# Patient Record
Sex: Female | Born: 1935 | Race: White | Hispanic: No | Smoking: Former smoker
Health system: Southern US, Community
[De-identification: ages and names within clinical notes are randomized; demographics above are authoritative.]

## PROBLEM LIST (undated history)

## (undated) DIAGNOSIS — K2970 Gastritis, unspecified, without bleeding: Secondary | ICD-10-CM

## (undated) DIAGNOSIS — I2510 Atherosclerotic heart disease of native coronary artery without angina pectoris: Secondary | ICD-10-CM

## (undated) DIAGNOSIS — R55 Syncope and collapse: Secondary | ICD-10-CM

## (undated) DIAGNOSIS — IMO0002 Reserved for concepts with insufficient information to code with codable children: Secondary | ICD-10-CM

## (undated) DIAGNOSIS — H3530 Unspecified macular degeneration: Secondary | ICD-10-CM

## (undated) DIAGNOSIS — H269 Unspecified cataract: Secondary | ICD-10-CM

## (undated) DIAGNOSIS — M1990 Unspecified osteoarthritis, unspecified site: Secondary | ICD-10-CM

## (undated) DIAGNOSIS — Z87891 Personal history of nicotine dependence: Secondary | ICD-10-CM

## (undated) DIAGNOSIS — R079 Chest pain, unspecified: Secondary | ICD-10-CM

## (undated) HISTORY — DX: Unspecified macular degeneration: H35.30

## (undated) HISTORY — DX: Gastritis, unspecified, without bleeding: K29.70

## (undated) HISTORY — DX: Unspecified osteoarthritis, unspecified site: M19.90

## (undated) HISTORY — DX: Unspecified cataract: H26.9

## (undated) HISTORY — PX: BREAST BIOPSY: SHX20

## (undated) HISTORY — DX: Reserved for concepts with insufficient information to code with codable children: IMO0002

---
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
## 1961-05-13 HISTORY — PX: EXPLORATORY LAPAROTOMY: SUR591

## 1987-05-14 HISTORY — PX: CHOLECYSTECTOMY: SHX55

## 2006-12-14 ENCOUNTER — Emergency Department: Payer: Self-pay | Admitting: Emergency Medicine

## 2007-04-29 ENCOUNTER — Ambulatory Visit: Payer: Self-pay | Admitting: Internal Medicine

## 2010-03-28 ENCOUNTER — Ambulatory Visit: Payer: Self-pay | Admitting: Gastroenterology

## 2010-03-29 LAB — PATHOLOGY REPORT

## 2010-04-02 ENCOUNTER — Ambulatory Visit: Payer: Self-pay | Admitting: Gastroenterology

## 2010-08-28 ENCOUNTER — Ambulatory Visit: Payer: Self-pay | Admitting: Internal Medicine

## 2010-09-11 ENCOUNTER — Other Ambulatory Visit: Payer: Self-pay | Admitting: Gastroenterology

## 2010-09-13 LAB — CANCER ANTIGEN 19-9: CA 19-9: 60 U/mL — ABNORMAL HIGH (ref 0–35)

## 2010-09-23 ENCOUNTER — Ambulatory Visit: Payer: Self-pay | Admitting: Emergency Medicine

## 2010-10-03 ENCOUNTER — Ambulatory Visit: Payer: Self-pay | Admitting: Gastroenterology

## 2011-04-15 ENCOUNTER — Ambulatory Visit: Payer: Self-pay | Admitting: Ophthalmology

## 2011-04-23 ENCOUNTER — Ambulatory Visit: Payer: Self-pay | Admitting: Ophthalmology

## 2011-05-16 ENCOUNTER — Encounter: Payer: Self-pay | Admitting: Internal Medicine

## 2011-05-16 ENCOUNTER — Ambulatory Visit (INDEPENDENT_AMBULATORY_CARE_PROVIDER_SITE_OTHER): Payer: Medicare Other | Admitting: Internal Medicine

## 2011-05-16 VITALS — BP 110/66 | HR 56 | Temp 97.7°F | Wt 113.0 lb

## 2011-05-16 DIAGNOSIS — Z23 Encounter for immunization: Secondary | ICD-10-CM

## 2011-05-16 DIAGNOSIS — H6690 Otitis media, unspecified, unspecified ear: Secondary | ICD-10-CM

## 2011-05-16 DIAGNOSIS — H3530 Unspecified macular degeneration: Secondary | ICD-10-CM

## 2011-05-16 DIAGNOSIS — Z1239 Encounter for other screening for malignant neoplasm of breast: Secondary | ICD-10-CM

## 2011-05-16 DIAGNOSIS — D0430 Carcinoma in situ of skin of unspecified part of face: Secondary | ICD-10-CM

## 2011-05-16 DIAGNOSIS — D0439 Carcinoma in situ of skin of other parts of face: Secondary | ICD-10-CM

## 2011-05-16 DIAGNOSIS — K8050 Calculus of bile duct without cholangitis or cholecystitis without obstruction: Secondary | ICD-10-CM

## 2011-05-16 MED ORDER — AZITHROMYCIN 500 MG PO TABS: 500.0000 mg | ORAL_TABLET | Freq: Every day | ORAL | Status: AC

## 2011-05-16 NOTE — Patient Instructions (Addendum)
Your sinus symptoms sound viral,  But your left ear looks infected so I am prescribing Azithromycin for 5 days of antibiotic..   You can use sudafed PE  to decrease the pressure in the ear and the sinuses 10 mg every 6 hours,  (OTC)  Try Delsym for that dry cough ,  Mucinex for a thick hard to break up cough   Gargle with salt water for a scratchy throat.  Rinse your sinuses with sterile salt water Simply Saline twice daily

## 2011-05-16 NOTE — Progress Notes (Signed)
Subjective:    Patient ID: Debra Hubbard, female    DOB: 03-19-1927, 78 y.o.   MRN: [id number]  HPI  Debra Hubbard is an 78 yo white female with history of a deviated septum due to prior trauma (remote) , recent cataract surgery,who  presents with a 4 day history of sinus drainage and persistent congestion.  She has not taken any OTC meds for her current symptoms because she had cataract surgery on the right eye 3 weeks ago and is using an antibiotic drop.  She denies myalgias, heach or neck aches, and fevers. She has had a nonproductive cough, not particularly troublesome, and some sneezing.  She has no prior history of influenza vaccination and is requesting one today.    Past Medical History  Diagnosis Date  . Macular degeneration   . Cataracts, bilateral   . Squamous cell carcinoma     face, s/p excision  . Degenerative joint disease   . Gastritis     followed by Dr. Bluford Kaufmann    No current outpatient prescriptions on file prior to visit.    Review of Systems  Constitutional: Negative for fever, chills and unexpected weight change.  HENT: Positive for congestion, rhinorrhea, sneezing, postnasal drip and sinus pressure. Negative for hearing loss, ear pain, nosebleeds, sore throat, facial swelling, mouth sores, trouble swallowing, neck pain, neck stiffness, voice change, tinnitus and ear discharge.   Eyes: Negative for pain, discharge, redness and visual disturbance.  Respiratory: Positive for cough. Negative for chest tightness, shortness of breath, wheezing and stridor.   Cardiovascular: Negative for chest pain, palpitations and leg swelling.  Musculoskeletal: Negative for myalgias and arthralgias.  Skin: Negative for color change and rash.  Neurological: Negative for dizziness, weakness, light-headedness and headaches.  Hematological: Negative for adenopathy.   BP 110/66  Pulse 56  Temp(Src) 97.7 F (36.5 C) (Oral)  Wt 113 lb (51.256 kg)     Objective:   Physical Exam  Vitals  reviewed. Constitutional: She is oriented to person, place, and time. She appears well-developed and well-nourished.  HENT:  Right Ear: Tympanic membrane is erythematous. A middle ear effusion is present.  Left Ear: Tympanic membrane is erythematous. A middle ear effusion is present.  Mouth/Throat: Uvula is midline and mucous membranes are normal. Posterior oropharyngeal erythema present.  Eyes: EOM are normal. Pupils are equal, round, and reactive to light. No scleral icterus.  Neck: Normal range of motion. Neck supple. No JVD present. No thyromegaly present.  Cardiovascular: Normal rate, regular rhythm, normal heart sounds and intact distal pulses.   Pulmonary/Chest: Effort normal and breath sounds normal.  Abdominal: Soft. Bowel sounds are normal. She exhibits no mass. There is no tenderness.  Musculoskeletal: Normal range of motion. She exhibits no edema.  Lymphadenopathy:    She has no cervical adenopathy.  Neurological: She is alert and oriented to person, place, and time.  Skin: Skin is warm and dry.  Psychiatric: She has a normal mood and affect.      Assessment & Plan:  Otitis media: Involving both ears, left greater than right;  secondary to sinus congestion.  Will treat with antiiobiotic and decongestant.  Choledocholithiasis She underwent ERCP by Lutricia Feil in June 2012 for recurrent abdominal pain with abnormal liver enzymes and had mass was found which was biopsied,  Path report inconclusive, and she was referred to [facility] for further testing.  Records of additional evaluation are not available, so the diagnosis is unclear. Records to be requested from [facility] and  [facility]  Macular degeneration Managed by [facility],  Screening for breast cancer She reports that her last screening was 2008.  She has had several biopsies in the past which were nonmalignant.   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 Roseanne Kaufman     Updated Medication  List Outpatient Encounter Prescriptions as of 05/16/2011  Medication Sig Dispense Refill  . Multiple Vitamin (MULTIVITAMIN) tablet Take 1 tablet by mouth daily.        . Multiple Vitamins-Minerals (VISION-VITE PRESERVE PO) Take one by mouth twice a day.       Marland Kitchen azithromycin (ZITHROMAX) 500 MG tablet Take 1 tablet (500 mg total) by mouth daily.  5 tablet  0

## 2011-05-18 ENCOUNTER — Encounter: Payer: Self-pay | Admitting: Internal Medicine

## 2011-05-18 DIAGNOSIS — K8050 Calculus of bile duct without cholangitis or cholecystitis without obstruction: Secondary | ICD-10-CM | POA: Insufficient documentation

## 2011-05-18 DIAGNOSIS — H3530 Unspecified macular degeneration: Secondary | ICD-10-CM | POA: Insufficient documentation

## 2011-05-18 DIAGNOSIS — Z1239 Encounter for other screening for malignant neoplasm of breast: Secondary | ICD-10-CM | POA: Insufficient documentation

## 2011-05-18 DIAGNOSIS — D0430 Carcinoma in situ of skin of unspecified part of face: Secondary | ICD-10-CM | POA: Insufficient documentation

## 2011-05-18 NOTE — Assessment & Plan Note (Signed)
She reports that her last screenign was 2008.  She has had several biopsies in the past which were nonmalignant.

## 2011-05-18 NOTE — Assessment & Plan Note (Signed)
She underwent ERCP by Lutricia Feil in June 2012 for recurrent abdominal pain with abnormal liver enzymes and had mass was found which was biopsied,  Path report inconclusive, and she was referred to [facility] for further testing.  Records of additional evaluation are not available, so the diagnosis is unclear. Records to be requested from [facility] and [facility]

## 2011-05-18 NOTE — Assessment & Plan Note (Signed)
Excised by Dr. Roseanne Kaufman

## 2011-05-18 NOTE — Assessment & Plan Note (Signed)
Managed by [facility],

## 2011-06-10 ENCOUNTER — Encounter: Payer: Self-pay | Admitting: Internal Medicine

## 2011-08-21 ENCOUNTER — Ambulatory Visit (INDEPENDENT_AMBULATORY_CARE_PROVIDER_SITE_OTHER): Payer: Medicare Other | Admitting: Internal Medicine

## 2011-08-21 ENCOUNTER — Encounter: Payer: Self-pay | Admitting: Internal Medicine

## 2011-08-21 VITALS — BP 114/62 | HR 68 | Temp 98.2°F | Resp 16 | Wt 118.0 lb

## 2011-08-21 DIAGNOSIS — J0100 Acute maxillary sinusitis, unspecified: Secondary | ICD-10-CM

## 2011-08-21 MED ORDER — AMOXICILLIN-POT CLAVULANATE 875-125 MG PO TABS: 1.0000 | ORAL_TABLET | Freq: Two times a day (BID) | ORAL | Status: DC

## 2011-08-21 MED ORDER — LEVOFLOXACIN 500 MG PO TABS: 500.0000 mg | ORAL_TABLET | Freq: Every day | ORAL | Status: AC

## 2011-08-21 NOTE — Progress Notes (Signed)
Patient ID: Debra Hubbard, female   DOB: 04-26-1927, 78 y.o.   MRN: [id number] Patient Active Problem List  Diagnoses  . Choledocholithiasis  . Screening for breast cancer  . Macular degeneration  . 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 Sinusitis, acute maxillary    Subjective:  CC:   Chief Complaint  Patient presents with  . Nasal Congestion    HPI:   Debra Hubbard a 78 y.o. female who presents with post nasal drip which started last Friday.  She has had no relief with benadryl.   No fevers,  Dizziness or feeling off balance.  Has not taken sudafed PE but tried using Simply Saline last night with no relief of congestion on left side which remains congested and painful.     Past Medical History  Diagnosis Date  . Macular degeneration   . Cataracts, bilateral   . Squamous cell carcinoma     face, s/p excision  . Degenerative joint disease   . Gastritis     followed by Dr. Bluford Kaufmann    Past Surgical History  Procedure Date  . Appendectomy 1957  . Oophorectomy 1957  . Exploratory laparotomy 1963  . Breast biopsy     x 3, all normal         The following portions of the patient's history were reviewed and updated as appropriate: Allergies, current medications, and problem list.    Review of Systems:   12 Pt  review of systems was negative except those addressed in the HPI.     History   Social History  . Marital Status: Widowed    Spouse Name: N/A    Number of Children: N/A  . Years of Education: N/A   Occupational History  . Not on file.   Social History Main Topics  . Smoking status: Former Smoker    Quit date: 08/20/1984  . Smokeless tobacco: Never Used  . Alcohol Use: No  . Drug Use: No  . Sexually Active: Not on file   Other Topics Concern  . Not on file   Social History Narrative  . No narrative on file    Objective:  BP 114/62  Pulse 68  Temp(Src) 98.2 F (36.8 C) (Oral)  Resp 16  Wt 118 lb (53.524 kg)  SpO2 96%   General appearance: alert, cooperative and appears stated age Ears: normal Right TM' and external ear canals both ears; Left TM injected slightly  Nose:  palpably tender over left maxillary sinus,   Throat: lips, mucosa, and tongue normal; teeth and gums normal Neck: no adenopathy, no carotid bruit, supple, symmetrical, trachea midline and thyroid not enlarged, symmetric, no tenderness/mass/nodules Back: symmetric, no curvature. ROM normal. No CVA tenderness. Lungs: clear to auscultation bilaterally Heart: regular rate and rhythm, S1, S2 normal, no murmur, click, rub or gallop Abdomen: soft, non-tender; bowel sounds normal; no masses,  no organomegaly Pulses: 2+ and symmetric Skin: Skin color, texture, turgor normal. No rashes or lesions Lymph nodes: Cervical, supraclavicular, and axillary nodes normal.  Assessment and Plan:  Sinusitis, acute maxillary Given chronicity of symptoms, development of facial pain and exam consistent with bacterial URI,  Will treat with empiric antibiotics, decongestants, and saline lavage.     Updated Medication List Outpatient Encounter Prescriptions as of 08/21/2011  Medication Sig Dispense Refill  . Multiple Vitamin (MULTIVITAMIN) tablet Take 1 tablet by mouth daily.        . Multiple Vitamins-Minerals (VISION-VITE PRESERVE PO) Take one by  mouth twice a day.       . levofloxacin (LEVAQUIN) 500 MG tablet Take 1 tablet (500 mg total) by mouth daily. PLEASE DISREGARD/CANCEL THE AUGMENTIN RX PREVIOUSLY SENT  7 tablet  7  . DISCONTD: amoxicillin-clavulanate (AUGMENTIN) 875-125 MG per tablet Take 1 tablet by mouth 2 (two) times daily.  14 tablet  0     No orders of the defined types were placed in this encounter.    No Follow-up on file.

## 2011-08-21 NOTE — Patient Instructions (Signed)
You have a sinus/ear infection.  I am prescribing an antibiotic (augmentin) to manage the infection and the inflammation in your ear/sinuses.   I also advise use of the following OTC meds to help with your other symptoms.   Take generic OTC benadryl 25 mg every 8 hours for the drainage,  Sudafed PE  10 to 30 mg every 8 hours for the congestion, you may substitute Afrin nasal spray for the nighttime dose of sudafed PE  If needed to prevent insomnia.  Flush  your sinuses twice daily with Simply Saline (do over the sink because if you do it right you will spit out globs of mucus)  Gargle with salt water as needed for sore throat.

## 2011-08-25 ENCOUNTER — Encounter: Payer: Self-pay | Admitting: Internal Medicine

## 2011-08-25 NOTE — Assessment & Plan Note (Signed)
Given chronicity of symptoms, development of facial pain and exam consistent with bacterial URI,  Will treat with empiric antibiotics, decongestants, and saline lavage.   

## 2011-11-18 IMAGING — RF DG UGI W/ SMALL BOWEL
2 series · 7 of 24 positions shown · non-contrast
Comparison: none

REASON FOR EXAM: abd pain and vomiting
COMMENTS:

[Series 1: view not recorded · 0.17mm/px · 4 of 5 slices shown]
[im 1/5]
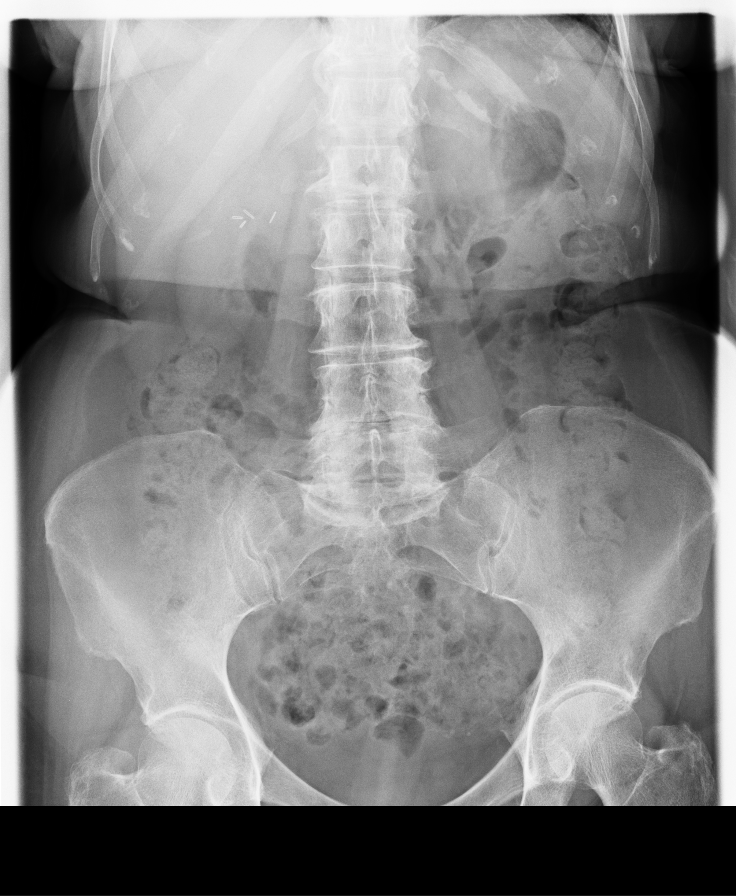
[im 2/5]
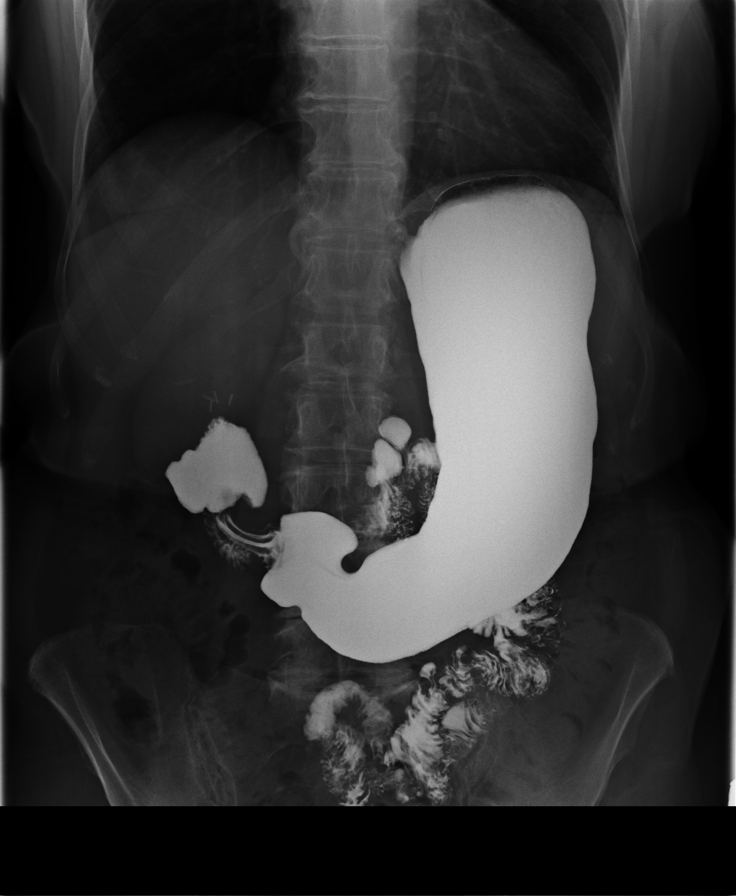
[im 3/5]
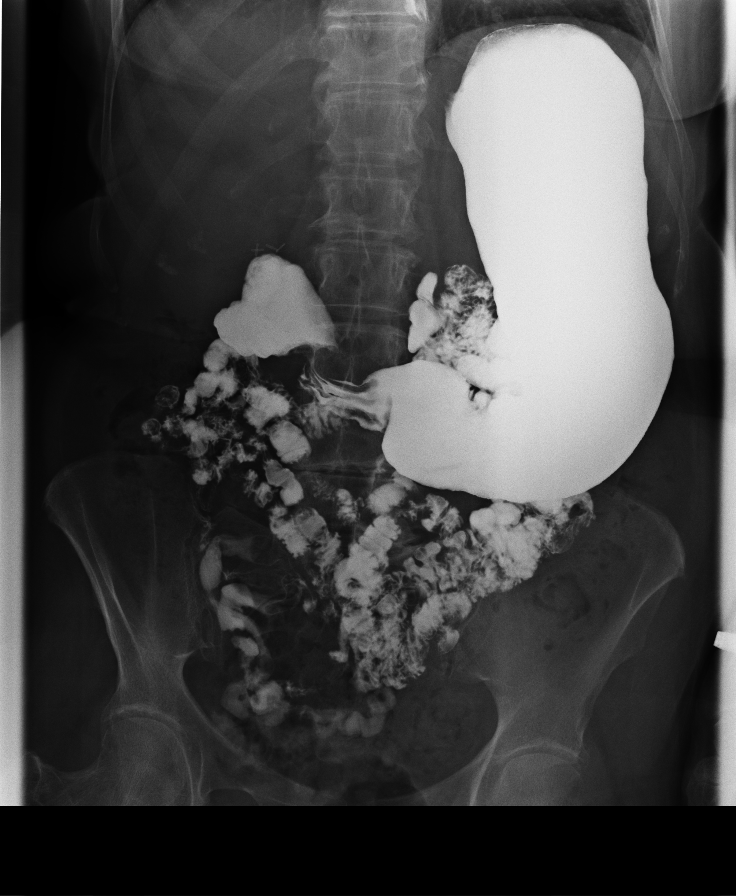
[im 5/5]
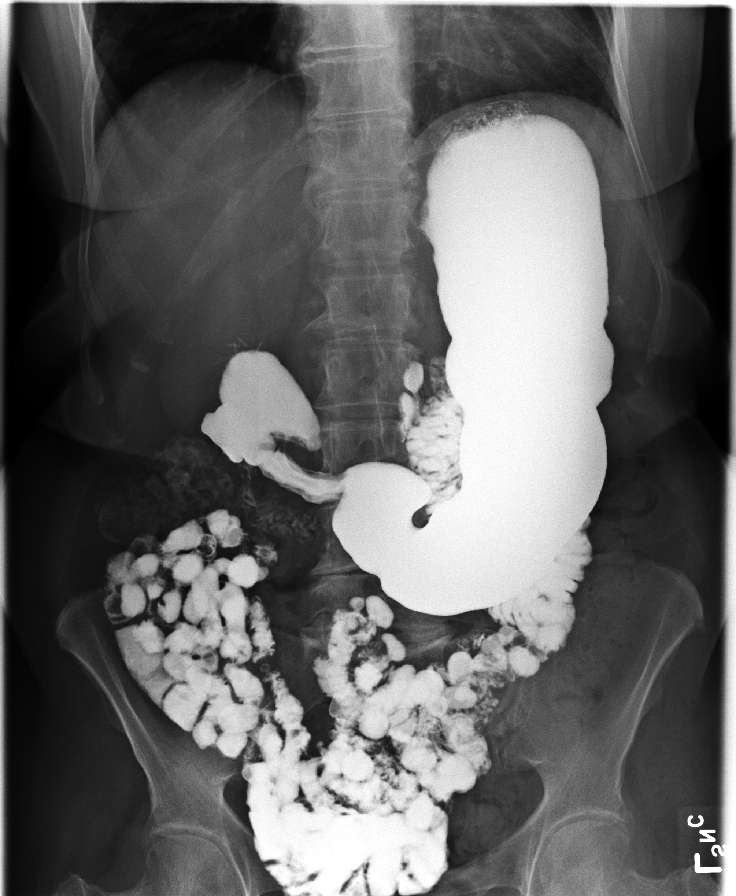

[Series 1: run · 3 of 22 slices shown]
[im 1/22]
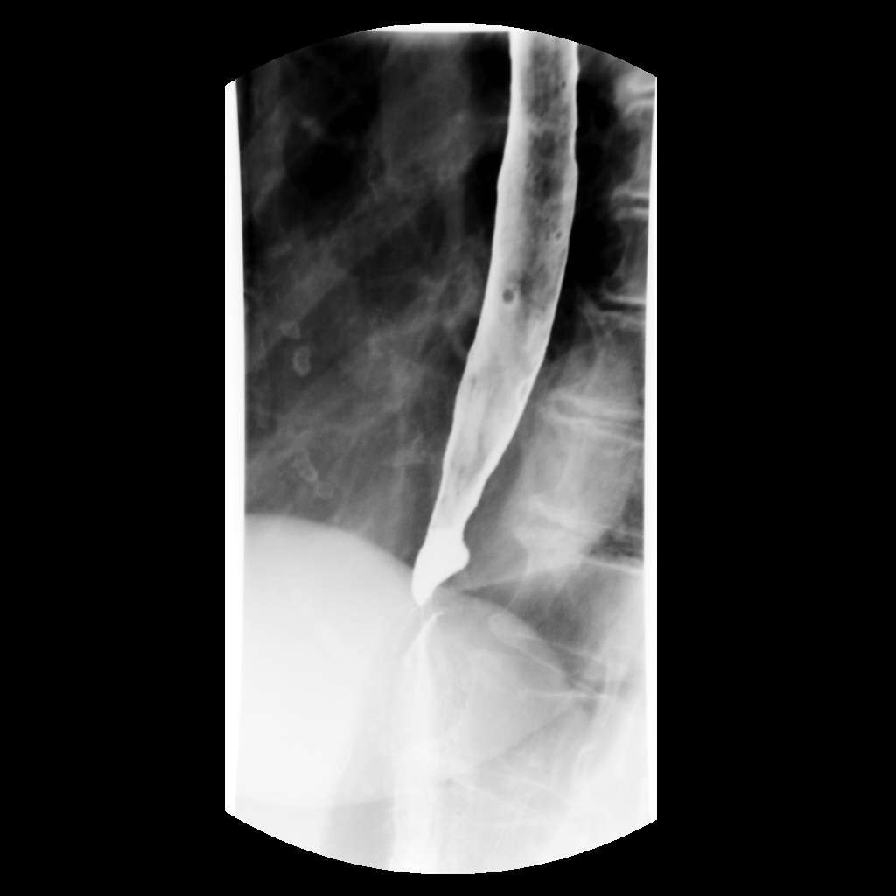
[im 2/22]
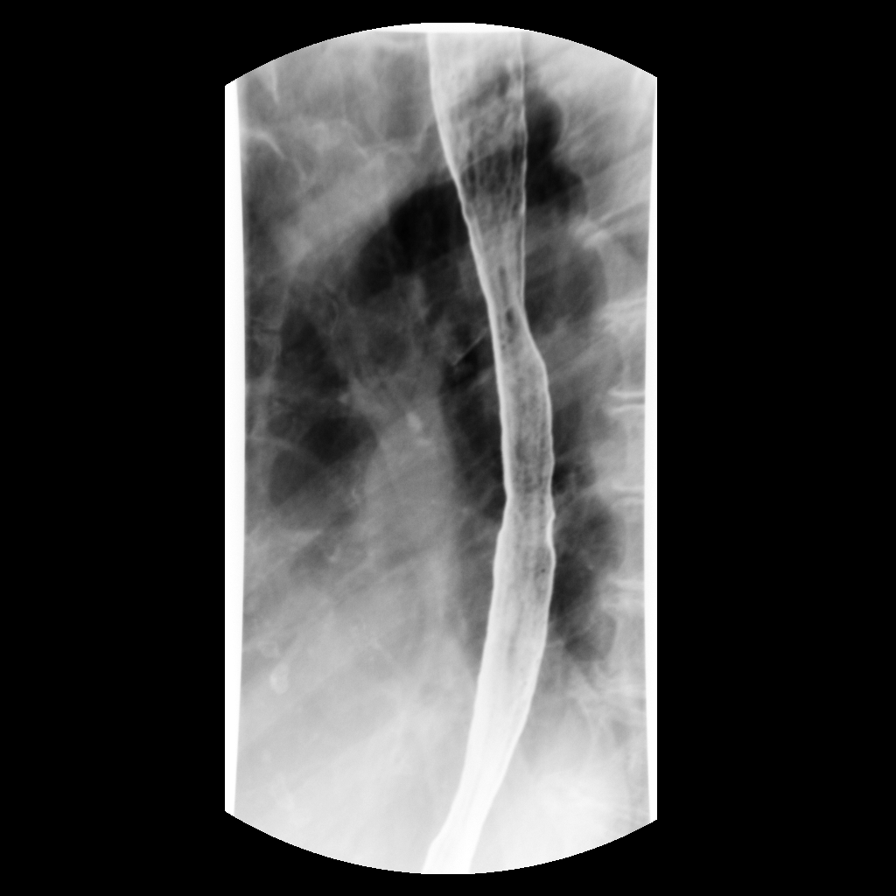
[im 3/22]
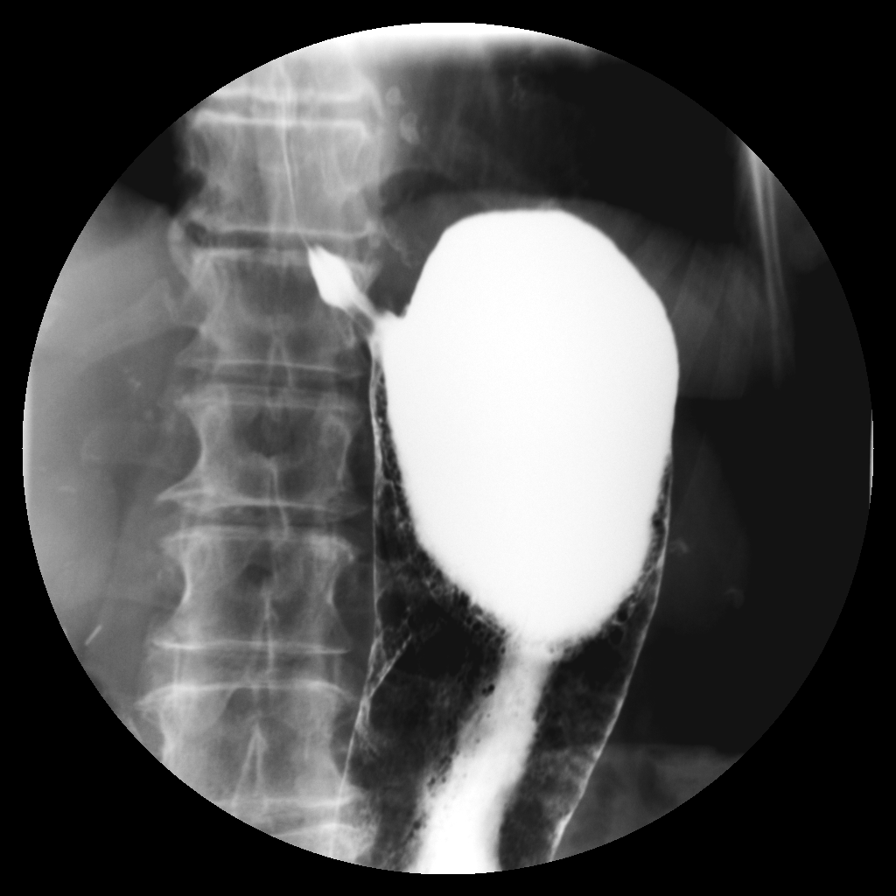

[7 of 24 positions shown; findings below may reference images not displayed]

PROCEDURE:     FL  - FL UPPER GI WITH SMALL BOWEL  - August 28, 2010 [DATE]

RESULT:     The anticipated procedure was discussed with Ms. Jim. She
voiced her willingness to proceed. A scout film revealed a normal stool and
gas pattern though there may be an element of constipation present. There
are surgical clips in the gallbladder fossa. Very mild degenerative change
of the lumbar spine is noted.

The patient ingested barium without difficulty. The thoracic esophagus is
normal in a survey fashion. A tiny reducible hiatal hernia is demonstrated.
The stomach distends well. The mucosal fold pattern appeared normal. Gastric
emptying also is normal in appearance. There are multiple duodenal
diverticula noted. The largest is in the the second portion of the duodenum.
It measures at least 2.5 cm in diameter. There is no evidence of obstruction
to gastric emptying. The 12 mm barium pill passes without difficulty.

Over the course of approximately 45 minutes the barium passed through the
small bowel to the right colon. The terminal ileum is normal in appearance.
No abnormally dilated or narrowed small bowel loops are demonstrated. The
patient described tenderness in the epigastrium during palpation of the
abdomen.
IMPRESSION: 1. There is a small hiatal hernia but the stomach is normal in appearance.
2. There are numerous duodenal diverticula. There is no evidence of duodenal
obstruction. No ulceration of the margins of these diverticula is
demonstrated.
3. The jejunum and ileum demonstrate normal mucosal patterns with no
evidence of obstruction.
4. The terminal ileum is normal in appearance.

## 2012-02-27 ENCOUNTER — Ambulatory Visit (INDEPENDENT_AMBULATORY_CARE_PROVIDER_SITE_OTHER): Payer: Medicare Other | Admitting: Internal Medicine

## 2012-02-27 ENCOUNTER — Encounter: Payer: Self-pay | Admitting: *Deleted

## 2012-02-27 ENCOUNTER — Encounter: Payer: Self-pay | Admitting: Internal Medicine

## 2012-02-27 VITALS — BP 128/62 | HR 56 | Temp 98.6°F | Wt 118.5 lb

## 2012-02-27 DIAGNOSIS — R5383 Other fatigue: Secondary | ICD-10-CM

## 2012-02-27 DIAGNOSIS — R5381 Other malaise: Secondary | ICD-10-CM

## 2012-02-27 DIAGNOSIS — E785 Hyperlipidemia, unspecified: Secondary | ICD-10-CM

## 2012-02-27 DIAGNOSIS — D649 Anemia, unspecified: Secondary | ICD-10-CM

## 2012-02-27 DIAGNOSIS — D510 Vitamin B12 deficiency anemia due to intrinsic factor deficiency: Secondary | ICD-10-CM

## 2012-02-27 DIAGNOSIS — Z23 Encounter for immunization: Secondary | ICD-10-CM

## 2012-02-27 DIAGNOSIS — E039 Hypothyroidism, unspecified: Secondary | ICD-10-CM

## 2012-02-27 DIAGNOSIS — J309 Allergic rhinitis, unspecified: Secondary | ICD-10-CM

## 2012-02-27 LAB — CBC WITH DIFFERENTIAL/PLATELET
Basophils Absolute: 0 10*3/uL (ref 0.0–0.1)
Eosinophils Absolute: 0.1 10*3/uL (ref 0.0–0.7)
Hemoglobin: 13.4 g/dL (ref 12.0–15.0)
Lymphocytes Relative: 31.5 % (ref 12.0–46.0)
MCHC: 33.2 g/dL (ref 30.0–36.0)
Monocytes Relative: 10.4 % (ref 3.0–12.0)
Neutrophils Relative %: 55.7 % (ref 43.0–77.0)
RBC: 4.39 Mil/uL (ref 3.87–5.11)
RDW: 13.2 % (ref 11.5–14.6)

## 2012-02-27 LAB — COMPREHENSIVE METABOLIC PANEL
ALT: 13 U/L (ref 0–35)
AST: 22 U/L (ref 0–37)
Albumin: 3.7 g/dL (ref 3.5–5.2)
CO2: 31 mEq/L (ref 19–32)
Calcium: 9.2 mg/dL (ref 8.4–10.5)
Chloride: 102 mEq/L (ref 96–112)
GFR: 117.1 mL/min (ref >60.00)
Potassium: 3.8 mEq/L (ref 3.5–5.1)
Sodium: 138 mEq/L (ref 135–145)
Total Protein: 7.2 g/dL (ref 6.0–8.3)

## 2012-02-27 LAB — TSH: TSH: 1.02 u[IU]/mL (ref 0.35–5.50)

## 2012-02-27 LAB — VITAMIN B12: Vitamin B-12: 310 pg/mL (ref 211–911)

## 2012-02-27 LAB — LIPID PANEL
HDL: 40.8 mg/dL (ref >39.00)
Total CHOL/HDL Ratio: 4

## 2012-02-27 NOTE — Assessment & Plan Note (Signed)
Symptoms are most consistent with allergic rhinitis. Will start Claritin or Zyrtec over-the-counter at bedtime. We also discussed potentially adding nasal steroid but patient would like to defer for now. Patient will call if symptoms are not improving.

## 2012-02-27 NOTE — Assessment & Plan Note (Signed)
Fatigue has been chronic. No focal symptoms. Will check basic lab her today including CBC, CMP, TSH, and B12. Followup in 3 months.

## 2012-02-27 NOTE — Progress Notes (Signed)
  Subjective:    Patient ID: Debra Hubbard, female    DOB: [birth year], 78 y.o.   MRN: [id number]  HPI 78 year old female presents for acute visit complaining of clear nasal drainage, watery eyes, and occasional sneezing which have occurred over the last couple of weeks. She denies any fever, chills, ear pain, sinus pressure. She denies shortness of breath. She reports she is generally been feeling well. She does acknowledge some decreased energy level which has been chronic for her. She reports she is sleeping well. She denies any change in appetite or weight loss.  Outpatient Encounter Prescriptions as of 02/27/2012  Medication Sig Dispense Refill  . Multiple Vitamin (MULTIVITAMIN) tablet Take 1 tablet by mouth daily.        . Multiple Vitamins-Minerals (VISION-VITE PRESERVE PO) Take one by mouth twice a day.        BP 128/62  Pulse 56  Temp 98.6 F (37 C) (Oral)  Wt 118 lb 8 oz (53.751 kg)  SpO2 96%  Review of Systems  Constitutional: Positive for fatigue. Negative for fever, chills, appetite change and unexpected weight change.  HENT: Positive for rhinorrhea, sneezing and postnasal drip. Negative for ear pain, congestion, sore throat, trouble swallowing, neck pain, voice change and sinus pressure.   Eyes: Positive for itching. Negative for visual disturbance.  Respiratory: Negative for cough, shortness of breath, wheezing and stridor.   Cardiovascular: Negative for chest pain, palpitations and leg swelling.  Gastrointestinal: Negative for nausea, vomiting, abdominal pain, diarrhea, constipation, blood in stool, abdominal distention and anal bleeding.  Genitourinary: Negative for dysuria and flank pain.  Musculoskeletal: Negative for myalgias, arthralgias and gait problem.  Skin: Negative for color change and rash.  Neurological: Negative for dizziness and headaches.  Hematological: Negative for adenopathy. Does not bruise/bleed easily.  Psychiatric/Behavioral: Negative for suicidal  ideas, disturbed wake/sleep cycle and dysphoric mood. The patient is not nervous/anxious.        Objective:   Physical Exam  Constitutional: She is oriented to person, place, and time. She appears well-developed and well-nourished. No distress.  HENT:  Head: Normocephalic and atraumatic.  Right Ear: External ear normal.  Left Ear: External ear normal.  Nose: Nose normal.  Mouth/Throat: Oropharynx is clear and moist. No oropharyngeal exudate.  Eyes: Conjunctivae normal are normal. Pupils are equal, round, and reactive to light. Right eye exhibits no discharge. Left eye exhibits no discharge. No scleral icterus.  Neck: Normal range of motion. Neck supple. No tracheal deviation present. No thyromegaly present.  Cardiovascular: Normal rate, regular rhythm, normal heart sounds and intact distal pulses.  Exam reveals no gallop and no friction rub.   No murmur heard. Pulmonary/Chest: Effort normal and breath sounds normal. No respiratory distress. She has no wheezes. She has no rales. She exhibits no tenderness.  Musculoskeletal: Normal range of motion. She exhibits no edema and no tenderness.  Lymphadenopathy:    She has no cervical adenopathy.  Neurological: She is alert and oriented to person, place, and time. No cranial nerve deficit. She exhibits normal muscle tone. Coordination normal.  Skin: Skin is warm and dry. No rash noted. She is not diaphoretic. No erythema. No pallor.  Psychiatric: She has a normal mood and affect. Her behavior is normal. Judgment and thought content normal.          Assessment & Plan:

## 2012-04-01 ENCOUNTER — Ambulatory Visit (INDEPENDENT_AMBULATORY_CARE_PROVIDER_SITE_OTHER): Payer: Medicare Other | Admitting: Internal Medicine

## 2012-04-01 ENCOUNTER — Encounter: Payer: Self-pay | Admitting: Internal Medicine

## 2012-04-01 VITALS — BP 116/66 | HR 61 | Temp 98.0°F | Resp 16 | Ht 62.5 in | Wt 116.8 lb

## 2012-04-01 DIAGNOSIS — M5432 Sciatica, left side: Secondary | ICD-10-CM

## 2012-04-01 DIAGNOSIS — M5430 Sciatica, unspecified side: Secondary | ICD-10-CM

## 2012-04-01 NOTE — Assessment & Plan Note (Signed)
Symptoms most consistent with left sciatic pain. Discussed evaluation with plain film lumbar spine and hip. Discussed using prednisone taper pack and PT, however pt would like to defer for now. She will continue prn tylenol and continue to monitor symptoms. Follow up prn.

## 2012-04-01 NOTE — Patient Instructions (Signed)
Sciatica with Rehab The sciatic nerve runs from the back down the leg and is responsible for sensation and control of the muscles in the back (posterior) side of the thigh, lower leg, and foot. Sciatica is a condition that is characterized by inflammation of this nerve.  SYMPTOMS   Signs of nerve damage, including numbness and/or weakness along the posterior side of the lower extremity.  Pain in the back of the thigh that may also travel down the leg.  Pain that worsens when sitting for long periods of time.  Occasionally, pain in the back or buttock. CAUSES  Inflammation of the sciatic nerve is the cause of sciatica. The inflammation is due to something irritating the nerve. Common sources of irritation include:  Sitting for long periods of time.  Direct trauma to the nerve.  Arthritis of the spine.  Herniated or ruptured disk.  Slipping of the vertebrae (spondylolithesis)  Pressure from soft tissues, such as muscles or ligament-like tissue (fascia). RISK INCREASES WITH:  Sports that place pressure or stress on the spine (football or weightlifting).  Poor strength and flexibility.  Failure to warm-up properly before activity.  Family history of low back pain or disk disorders.  Previous back injury or surgery.  Poor body mechanics, especially when lifting, or poor posture. PREVENTION   Warm up and stretch properly before activity.  Maintain physical fitness:  Strength, flexibility, and endurance.  Cardiovascular fitness.  Learn and use proper technique, especially with posture and lifting. When possible, have coach correct improper technique.  Avoid activities that place stress on the spine. PROGNOSIS If treated properly, then sciatica usually resolves within 6 weeks. However, occasionally surgery is necessary.  RELATED COMPLICATIONS   Permanent nerve damage, including pain, numbness, tingle, or weakness.  Chronic back pain.  Risks of surgery: infection,  bleeding, nerve damage, or damage to surrounding tissues. TREATMENT Treatment initially involves resting from any activities that aggravate your symptoms. The use of ice and medication may help reduce pain and inflammation. The use of strengthening and stretching exercises may help reduce pain with activity. These exercises may be performed at home or with referral to a therapist. A therapist may recommend further treatments, such as transcutaneous electronic nerve stimulation (TENS) or ultrasound. Your caregiver may recommend corticosteroid injections to help reduce inflammation of the sciatic nerve. If symptoms persist despite non-surgical (conservative) treatment, then surgery may be recommended. MEDICATION  If pain medication is necessary, then nonsteroidal anti-inflammatory medications, such as aspirin and ibuprofen, or other minor pain relievers, such as acetaminophen, are often recommended.  Do not take pain medication for 7 days before surgery.  Prescription pain relievers may be given if deemed necessary by your caregiver. Use only as directed and only as much as you need.  Ointments applied to the skin may be helpful.  Corticosteroid injections may be given by your caregiver. These injections should be reserved for the most serious cases, because they may only be given a certain number of times. HEAT AND COLD  Cold treatment (icing) relieves pain and reduces inflammation. Cold treatment should be applied for 10 to 15 minutes every 2 to 3 hours for inflammation and pain and immediately after any activity that aggravates your symptoms. Use ice packs or massage the area with a piece of ice (ice massage).  Heat treatment may be used prior to performing the stretching and strengthening activities prescribed by your caregiver, physical therapist, or athletic trainer. Use a heat pack or soak the injury in warm water. SEEK   MEDICAL CARE IF:  Treatment seems to offer no benefit, or the condition  worsens.  Any medications produce adverse side effects. EXERCISES  RANGE OF MOTION (ROM) AND STRETCHING EXERCISES - Sciatica Most people with sciatic will find that their symptoms worsen with either excessive bending forward (flexion) or arching at the low back (extension). The exercises which will help resolve your symptoms will focus on the opposite motion. Your physician, physical therapist or athletic trainer will help you determine which exercises will be most helpful to resolve your low back pain. Do not complete any exercises without first consulting with your clinician. Discontinue any exercises which worsen your symptoms until you speak to your clinician. If you have pain, numbness or tingling which travels down into your buttocks, leg or foot, the goal of the therapy is for these symptoms to move closer to your back and eventually resolve. Occasionally, these leg symptoms will get better, but your low back pain may worsen; this is typically an indication of progress in your rehabilitation. Be certain to be very alert to any changes in your symptoms and the activities in which you participated in the 24 hours prior to the change. Sharing this information with your clinician will allow him/her to most efficiently treat your condition. These exercises may help you when beginning to rehabilitate your injury. Your symptoms may resolve with or without further involvement from your physician, physical therapist or athletic trainer. While completing these exercises, remember:   Restoring tissue flexibility helps normal motion to return to the joints. This allows healthier, less painful movement and activity.  An effective stretch should be held for at least 30 seconds.  A stretch should never be painful. You should only feel a gentle lengthening or release in the stretched tissue. FLEXION RANGE OF MOTION AND STRETCHING EXERCISES: STRETCH  Flexion, Single Knee to Chest   Lie on a firm bed or floor  with both legs extended in front of you.  Keeping one leg in contact with the floor, bring your opposite knee to your chest. Hold your leg in place by either grabbing behind your thigh or at your knee.  Pull until you feel a gentle stretch in your low back. Hold __________ seconds.  Slowly release your grasp and repeat the exercise with the opposite side. Repeat __________ times. Complete this exercise __________ times per day.  STRETCH  Flexion, Double Knee to Chest  Lie on a firm bed or floor with both legs extended in front of you.  Keeping one leg in contact with the floor, bring your opposite knee to your chest.  Tense your stomach muscles to support your back and then lift your other knee to your chest. Hold your legs in place by either grabbing behind your thighs or at your knees.  Pull both knees toward your chest until you feel a gentle stretch in your low back. Hold __________ seconds.  Tense your stomach muscles and slowly return one leg at a time to the floor. Repeat __________ times. Complete this exercise __________ times per day.  STRETCH  Low Trunk Rotation   Lie on a firm bed or floor. Keeping your legs in front of you, bend your knees so they are both pointed toward the ceiling and your feet are flat on the floor.  Extend your arms out to the side. This will stabilize your upper body by keeping your shoulders in contact with the floor.  Gently and slowly drop both knees together to one side until you   feel a gentle stretch in your low back. Hold for __________ seconds.  Tense your stomach muscles to support your low back as you bring your knees back to the starting position. Repeat the exercise to the other side. Repeat __________ times. Complete this exercise __________ times per day  EXTENSION RANGE OF MOTION AND FLEXIBILITY EXERCISES: STRETCH  Extension, Prone on Elbows  Lie on your stomach on the floor, a bed will be too soft. Place your palms about shoulder  width apart and at the height of your head.  Place your elbows under your shoulders. If this is too painful, stack pillows under your chest.  Allow your body to relax so that your hips drop lower and make contact more completely with the floor.  Hold this position for __________ seconds.  Slowly return to lying flat on the floor. Repeat __________ times. Complete this exercise __________ times per day.  RANGE OF MOTION  Extension, Prone Press Ups  Lie on your stomach on the floor, a bed will be too soft. Place your palms about shoulder width apart and at the height of your head.  Keeping your back as relaxed as possible, slowly straighten your elbows while keeping your hips on the floor. You may adjust the placement of your hands to maximize your comfort. As you gain motion, your hands will come more underneath your shoulders.  Hold this position __________ seconds.  Slowly return to lying flat on the floor. Repeat __________ times. Complete this exercise __________ times per day.  STRENGTHENING EXERCISES - Sciatica  These exercises may help you when beginning to rehabilitate your injury. These exercises should be done near your "sweet spot." This is the neutral, low-back arch, somewhere between fully rounded and fully arched, that is your least painful position. When performed in this safe range of motion, these exercises can be used for people who have either a flexion or extension based injury. These exercises may resolve your symptoms with or without further involvement from your physician, physical therapist or athletic trainer. While completing these exercises, remember:   Muscles can gain both the endurance and the strength needed for everyday activities through controlled exercises.  Complete these exercises as instructed by your physician, physical therapist or athletic trainer. Progress with the resistance and repetition exercises only as your caregiver advises.  You may  experience muscle soreness or fatigue, but the pain or discomfort you are trying to eliminate should never worsen during these exercises. If this pain does worsen, stop and make certain you are following the directions exactly. If the pain is still present after adjustments, discontinue the exercise until you can discuss the trouble with your clinician. STRENGTHENING Deep Abdominals, Pelvic Tilt   Lie on a firm bed or floor. Keeping your legs in front of you, bend your knees so they are both pointed toward the ceiling and your feet are flat on the floor.  Tense your lower abdominal muscles to press your low back into the floor. This motion will rotate your pelvis so that your tail bone is scooping upwards rather than pointing at your feet or into the floor.  With a gentle tension and even breathing, hold this position for __________ seconds. Repeat __________ times. Complete this exercise __________ times per day.  STRENGTHENING  Abdominals, Crunches   Lie on a firm bed or floor. Keeping your legs in front of you, bend your knees so they are both pointed toward the ceiling and your feet are flat on the floor. Cross   your arms over your chest.  Slightly tip your chin down without bending your neck.  Tense your abdominals and slowly lift your trunk high enough to just clear your shoulder blades. Lifting higher can put excessive stress on the low back and does not further strengthen your abdominal muscles.  Control your return to the starting position. Repeat __________ times. Complete this exercise __________ times per day.  STRENGTHENING  Quadruped, Opposite UE/LE Lift  Assume a hands and knees position on a firm surface. Keep your hands under your shoulders and your knees under your hips. You may place padding under your knees for comfort.  Find your neutral spine and gently tense your abdominal muscles so that you can maintain this position. Your shoulders and hips should form a rectangle that  is parallel with the floor and is not twisted.  Keeping your trunk steady, lift your right hand no higher than your shoulder and then your left leg no higher than your hip. Make sure you are not holding your breath. Hold this position __________ seconds.  Continuing to keep your abdominal muscles tense and your back steady, slowly return to your starting position. Repeat with the opposite arm and leg. Repeat __________ times. Complete this exercise __________ times per day.  STRENGTHENING  Abdominals and Quadriceps, Straight Leg Raise   Lie on a firm bed or floor with both legs extended in front of you.  Keeping one leg in contact with the floor, bend the other knee so that your foot can rest flat on the floor.  Find your neutral spine, and tense your abdominal muscles to maintain your spinal position throughout the exercise.  Slowly lift your straight leg off the floor about 6 inches for a count of 15, making sure to not hold your breath.  Still keeping your neutral spine, slowly lower your leg all the way to the floor. Repeat this exercise with each leg __________ times. Complete this exercise __________ times per day. POSTURE AND BODY MECHANICS CONSIDERATIONS - Sciatica Keeping correct posture when sitting, standing or completing your activities will reduce the stress put on different body tissues, allowing injured tissues a chance to heal and limiting painful experiences. The following are general guidelines for improved posture. Your physician or physical therapist will provide you with any instructions specific to your needs. While reading these guidelines, remember:  The exercises prescribed by your provider will help you have the flexibility and strength to maintain correct postures.  The correct posture provides the optimal environment for your joints to work. All of your joints have less wear and tear when properly supported by a spine with good posture. This means you will  experience a healthier, less painful body.  Correct posture must be practiced with all of your activities, especially prolonged sitting and standing. Correct posture is as important when doing repetitive low-stress activities (typing) as it is when doing a single heavy-load activity (lifting). RESTING POSITIONS Consider which positions are most painful for you when choosing a resting position. If you have pain with flexion-based activities (sitting, bending, stooping, squatting), choose a position that allows you to rest in a less flexed posture. You would want to avoid curling into a fetal position on your side. If your pain worsens with extension-based activities (prolonged standing, working overhead), avoid resting in an extended position such as sleeping on your stomach. Most people will find more comfort when they rest with their spine in a more neutral position, neither too rounded nor too arched. Lying   on a non-sagging bed on your side with a pillow between your knees, or on your back with a pillow under your knees will often provide some relief. Keep in mind, being in any one position for a prolonged period of time, no matter how correct your posture, can still lead to stiffness. PROPER SITTING POSTURE In order to minimize stress and discomfort on your spine, you must sit with correct posture Sitting with good posture should be effortless for a healthy body. Returning to good posture is a gradual process. Many people can work toward this most comfortably by using various supports until they have the flexibility and strength to maintain this posture on their own. When sitting with proper posture, your ears will fall over your shoulders and your shoulders will fall over your hips. You should use the back of the chair to support your upper back. Your low back will be in a neutral position, just slightly arched. You may place a small pillow or folded towel at the base of your low back for support.  When  working at a desk, create an environment that supports good, upright posture. Without extra support, muscles fatigue and lead to excessive strain on joints and other tissues. Keep these recommendations in mind: CHAIR:   A chair should be able to slide under your desk when your back makes contact with the back of the chair. This allows you to work closely.  The chair's height should allow your eyes to be level with the upper part of your monitor and your hands to be slightly lower than your elbows. BODY POSITION  Your feet should make contact with the floor. If this is not possible, use a foot rest.  Keep your ears over your shoulders. This will reduce stress on your neck and low back. INCORRECT SITTING POSTURES   If you are feeling tired and unable to assume a healthy sitting posture, do not slouch or slump. This puts excessive strain on your back tissues, causing more damage and pain. Healthier options include:  Using more support, like a lumbar pillow.  Switching tasks to something that requires you to be upright or walking.  Talking a brief walk.  Lying down to rest in a neutral-spine position. PROLONGED STANDING WHILE SLIGHTLY LEANING FORWARD  When completing a task that requires you to lean forward while standing in one place for a long time, place either foot up on a stationary 2-4 inch high object to help maintain the best posture. When both feet are on the ground, the low back tends to lose its slight inward curve. If this curve flattens (or becomes too large), then the back and your other joints will experience too much stress, fatigue more quickly and can cause pain.  CORRECT STANDING POSTURES Proper standing posture should be assumed with all daily activities, even if they only take a few moments, like when brushing your teeth. As in sitting, your ears should fall over your shoulders and your shoulders should fall over your hips. You should keep a slight tension in your abdominal  muscles to brace your spine. Your tailbone should point down to the ground, not behind your body, resulting in an over-extended swayback posture.  INCORRECT STANDING POSTURES  Common incorrect standing postures include a forward head, locked knees and/or an excessive swayback. WALKING Walk with an upright posture. Your ears, shoulders and hips should all line-up. PROLONGED ACTIVITY IN A FLEXED POSITION When completing a task that requires you to bend forward at your   waist or lean over a low surface, try to find a way to stabilize 3 of 4 of your limbs. You can place a hand or elbow on your thigh or rest a knee on the surface you are reaching across. This will provide you more stability so that your muscles do not fatigue as quickly. By keeping your knees relaxed, or slightly bent, you will also reduce stress across your low back. CORRECT LIFTING TECHNIQUES DO :   Assume a wide stance. This will provide you more stability and the opportunity to get as close as possible to the object which you are lifting.  Tense your abdominals to brace your spine; then bend at the knees and hips. Keeping your back locked in a neutral-spine position, lift using your leg muscles. Lift with your legs, keeping your back straight.  Test the weight of unknown objects before attempting to lift them.  Try to keep your elbows locked down at your sides in order get the best strength from your shoulders when carrying an object.  Always ask for help when lifting heavy or awkward objects. INCORRECT LIFTING TECHNIQUES DO NOT:   Lock your knees when lifting, even if it is a small object.  Bend and twist. Pivot at your feet or move your feet when needing to change directions.  Assume that you cannot safely pick up a paperclip without proper posture. Document Released: 04/29/2005 Document Revised: 07/22/2011 Document Reviewed: 08/11/2008 [employer] Patient Information 2013 [employer]  

## 2012-04-01 NOTE — Progress Notes (Signed)
  Subjective:    Patient ID: Debra Hubbard, female    DOB: [birth year], 78 y.o.   MRN: [id number]  HPI 78 year old female presents for acute visit complaining of several week history of intermittent left-sided lower back and hip pain that radiates down the back of her left leg. She denies any weakness in her leg. She denies any traumatic injury to her leg or hip. She has been taking Tylenol 500 mg up to 3 times daily with some improvement in her symptoms.  Outpatient Encounter Prescriptions as of 04/01/2012  Medication Sig Dispense Refill  . Multiple Vitamin (MULTIVITAMIN) tablet Take 1 tablet by mouth daily.        . Multiple Vitamins-Minerals (VISION-VITE PRESERVE PO) Take one by mouth twice a day.        BP 116/66  Pulse 61  Temp 98 F (36.7 C) (Oral)  Resp 16  Ht 5' 2.5" (1.588 m)  Wt 116 lb 12 oz (52.957 kg)  BMI 21.01 kg/m2  SpO2 93%  Review of Systems  Constitutional: Negative for fever, chills, appetite change, fatigue and unexpected weight change.  HENT: Negative for ear pain, congestion, sore throat, trouble swallowing, neck pain, voice change and sinus pressure.   Eyes: Negative for visual disturbance.  Respiratory: Negative for cough, shortness of breath, wheezing and stridor.   Cardiovascular: Positive for leg swelling. Negative for chest pain and palpitations.  Gastrointestinal: Negative for nausea, vomiting, abdominal pain, diarrhea, constipation, blood in stool, abdominal distention and anal bleeding.  Genitourinary: Negative for dysuria and flank pain.  Musculoskeletal: Positive for myalgias. Negative for arthralgias and gait problem.  Skin: Negative for color change and rash.  Neurological: Negative for dizziness and headaches.  Hematological: Negative for adenopathy. Does not bruise/bleed easily.  Psychiatric/Behavioral: Negative for suicidal ideas, sleep disturbance and dysphoric mood. The patient is not nervous/anxious.        Objective:   Physical Exam    Constitutional: She is oriented to person, place, and time. She appears well-developed and well-nourished. No distress.  HENT:  Head: Normocephalic and atraumatic.  Right Ear: External ear normal.  Left Ear: External ear normal.  Nose: Nose normal.  Mouth/Throat: Oropharynx is clear and moist. No oropharyngeal exudate.  Eyes: Conjunctivae normal are normal. Pupils are equal, round, and reactive to light. Right eye exhibits no discharge. Left eye exhibits no discharge. No scleral icterus.  Neck: Normal range of motion. Neck supple. No tracheal deviation present. No thyromegaly present.  Cardiovascular: Normal rate, regular rhythm, normal heart sounds and intact distal pulses.  Exam reveals no gallop and no friction rub.   No murmur heard. Pulmonary/Chest: Effort normal and breath sounds normal. No respiratory distress. She has no wheezes. She has no rales. She exhibits no tenderness.  Musculoskeletal: Normal range of motion. She exhibits no edema and no tenderness.       Lumbar back: She exhibits tenderness.       Back:  Lymphadenopathy:    She has no cervical adenopathy.  Neurological: She is alert and oriented to person, place, and time. No cranial nerve deficit. She exhibits normal muscle tone. Coordination normal.  Skin: Skin is warm and dry. No rash noted. She is not diaphoretic. No erythema. No pallor.  Psychiatric: She has a normal mood and affect. Her behavior is normal. Judgment and thought content normal.          Assessment & Plan:

## 2012-06-02 ENCOUNTER — Ambulatory Visit (INDEPENDENT_AMBULATORY_CARE_PROVIDER_SITE_OTHER): Payer: Medicare Other | Admitting: Internal Medicine

## 2012-06-02 ENCOUNTER — Encounter: Payer: Self-pay | Admitting: Internal Medicine

## 2012-06-02 VITALS — BP 118/68 | HR 60 | Temp 98.2°F | Ht 62.0 in | Wt 116.8 lb

## 2012-06-02 DIAGNOSIS — Z0000 Encounter for general adult medical examination without abnormal findings: Secondary | ICD-10-CM

## 2012-06-02 NOTE — Assessment & Plan Note (Addendum)
General medical exam normal today including breast exam. Health maintenance UTD except for mammogram, last 2009, pt declines further. Immunizations UTD. Encouraged continued healthy diet and regular physical activity. Follow up 1 year and prn.

## 2012-06-02 NOTE — Progress Notes (Signed)
Subjective:    Patient ID: Debra Hubbard, female    DOB: 1927/03/22, 79 y.o.   MRN: [id number]  HPI The patient is here for annual Medicare wellness examination and management of other chronic and acute problems.   The risk factors are reflected in the social history.  The roster of all physicians providing medical care to patient - is listed in the Snapshot section of the chart.  Activities of daily living:  The patient is 100% independent in all ADLs: dressing, toileting, feeding as well as independent mobility  Home safety : The patient has smoke detectors in the home. They wear seatbelts.  There are no firearms at home. There is no violence in the home.   There is no risks for hepatitis, STDs or HIV. There is no history of blood transfusion. They have no travel history to infectious disease endemic areas of the world.  The patient has seen their dentist in the last six month. Dentist - Dr. Modesto Charon They have seen their eye doctor in the last year. Opthalmology - Dr. Angela Adam They have deferred audiologic testing in the last year.   They do not  have excessive sun exposure. Discussed the need for sun protection: hats, long sleeves and use of sunscreen if there is significant sun exposure.   Diet: the importance of a healthy diet is discussed. They do have a healthy diet.  The benefits of regular aerobic exercise were discussed. She keeps busy with housework and walks occasionally.  Depression screen: there are no signs or vegative symptoms of depression- irritability, change in appetite, anhedonia, sadness/tearfullness.  Cognitive assessment: the patient manages all their financial and personal affairs and is actively engaged. They could relate day,date,year and events.  HCPOA - Reece Packer, RN.  The following portions of the patient's history were reviewed and updated as appropriate: allergies, current medications, past family history, past medical history,  past surgical history,  past social history  and problem list.  Visual acuity was not assessed per patient preference since she has regular follow up with her ophthalmologist. Hearing and body mass index were assessed and reviewed.   During the course of the visit the patient was educated and counseled about appropriate screening and preventive services including : fall prevention , diabetes screening, nutrition counseling, colorectal cancer screening, and recommended immunizations.     Outpatient Encounter Prescriptions as of 06/02/2012  Medication Sig Dispense Refill  . acetaminophen (TYLENOL) 500 MG tablet Take 500 mg by mouth every 6 (six) hours as needed.      Marland Kitchen ibuprofen (ADVIL,MOTRIN) 200 MG tablet Take 200 mg by mouth every 6 (six) hours as needed.      . Multiple Vitamin (MULTIVITAMIN) tablet Take 1 tablet by mouth daily.        . Multiple Vitamins-Minerals (VISION-VITE PRESERVE PO) Take one by mouth twice a day.        BP 118/68  Pulse 60  Temp 98.2 F (36.8 C) (Oral)  Ht 5\' 2"  (1.575 m)  Wt 116 lb 12 oz (52.957 kg)  BMI 21.35 kg/m2  SpO2 95%  Review of Systems  Constitutional: Negative for fever, chills, appetite change, fatigue and unexpected weight change.  HENT: Negative for ear pain, congestion, sore throat, trouble swallowing, neck pain, voice change and sinus pressure.   Eyes: Negative for visual disturbance.  Respiratory: Negative for cough, shortness of breath, wheezing and stridor.   Cardiovascular: Negative for chest pain, palpitations and leg swelling.  Gastrointestinal: Negative for nausea, vomiting, abdominal  pain, diarrhea, constipation, blood in stool, abdominal distention and anal bleeding.  Genitourinary: Negative for dysuria and flank pain.  Musculoskeletal: Negative for myalgias, arthralgias and gait problem.  Skin: Negative for color change and rash.  Neurological: Negative for dizziness and headaches.  Hematological: Negative for adenopathy. Does not bruise/bleed easily.    Psychiatric/Behavioral: Negative for suicidal ideas, sleep disturbance and dysphoric mood. The patient is not nervous/anxious.        Objective:   Physical Exam  Constitutional: She is oriented to person, place, and time. She appears well-developed and well-nourished. No distress.  HENT:  Head: Normocephalic and atraumatic.  Right Ear: External ear normal.  Left Ear: External ear normal.  Nose: Nose normal.  Mouth/Throat: Oropharynx is clear and moist. No oropharyngeal exudate.  Eyes: Conjunctivae normal are normal. Pupils are equal, round, and reactive to light. Right eye exhibits no discharge. Left eye exhibits no discharge. No scleral icterus.  Neck: Normal range of motion. Neck supple. No tracheal deviation present. No thyromegaly present.  Cardiovascular: Normal rate, regular rhythm, normal heart sounds and intact distal pulses.  Exam reveals no gallop and no friction rub.   No murmur heard. Pulmonary/Chest: Effort normal and breath sounds normal. No respiratory distress. She has no wheezes. She has no rales. She exhibits no tenderness. Right breast exhibits no inverted nipple, no mass, no nipple discharge, no skin change and no tenderness. Left breast exhibits no inverted nipple, no mass, no nipple discharge, no skin change and no tenderness. Breasts are symmetrical.  Abdominal: Soft. Bowel sounds are normal. She exhibits no distension and no mass. There is no tenderness. There is no rebound and no guarding.  Musculoskeletal: Normal range of motion. She exhibits no edema and no tenderness.  Lymphadenopathy:    She has no cervical adenopathy.  Neurological: She is alert and oriented to person, place, and time. No cranial nerve deficit. She exhibits normal muscle tone. Coordination normal.  Skin: Skin is warm and dry. No rash noted. She is not diaphoretic. No erythema. No pallor.  Psychiatric: She has a normal mood and affect. Her behavior is normal. Judgment and thought content normal.           Assessment & Plan:

## 2013-03-30 ENCOUNTER — Ambulatory Visit (INDEPENDENT_AMBULATORY_CARE_PROVIDER_SITE_OTHER): Payer: Medicare Other

## 2013-03-30 DIAGNOSIS — Z23 Encounter for immunization: Secondary | ICD-10-CM

## 2013-06-04 ENCOUNTER — Encounter: Payer: Medicare Other | Admitting: Internal Medicine

## 2013-06-21 ENCOUNTER — Encounter: Payer: Self-pay | Admitting: *Deleted

## 2013-07-15 ENCOUNTER — Encounter: Payer: Medicare Other | Admitting: Internal Medicine

## 2013-11-10 ENCOUNTER — Telehealth: Payer: Self-pay | Admitting: Internal Medicine

## 2013-11-10 ENCOUNTER — Encounter: Payer: Self-pay | Admitting: Internal Medicine

## 2013-11-10 ENCOUNTER — Ambulatory Visit (INDEPENDENT_AMBULATORY_CARE_PROVIDER_SITE_OTHER): Payer: Medicare Other | Admitting: Internal Medicine

## 2013-11-10 VITALS — BP 108/62 | HR 66 | Temp 98.0°F | Ht 62.0 in | Wt 118.0 lb

## 2013-11-10 DIAGNOSIS — N390 Urinary tract infection, site not specified: Secondary | ICD-10-CM | POA: Insufficient documentation

## 2013-11-10 DIAGNOSIS — N3000 Acute cystitis without hematuria: Secondary | ICD-10-CM

## 2013-11-10 DIAGNOSIS — R300 Dysuria: Secondary | ICD-10-CM

## 2013-11-10 DIAGNOSIS — H6123 Impacted cerumen, bilateral: Secondary | ICD-10-CM

## 2013-11-10 DIAGNOSIS — N3001 Acute cystitis with hematuria: Secondary | ICD-10-CM

## 2013-11-10 DIAGNOSIS — H6120 Impacted cerumen, unspecified ear: Secondary | ICD-10-CM

## 2013-11-10 LAB — POCT URINALYSIS DIPSTICK
BILIRUBIN UA: NEGATIVE
GLUCOSE UA: NEGATIVE
KETONES UA: NEGATIVE
Nitrite, UA: NEGATIVE
PH UA: 7
Protein, UA: 30
Spec Grav, UA: 1.025
Urobilinogen, UA: 2

## 2013-11-10 MED ORDER — CIPROFLOXACIN HCL 500 MG PO TABS: 500.0000 mg | ORAL_TABLET | Freq: Two times a day (BID) | ORAL | Status: DC

## 2013-11-10 NOTE — Telephone Encounter (Signed)
Pt left vm stating she has a problem she would like to speak with Dr. Dan Humphreys about.  Asking for a call.  No further details left on vm.

## 2013-11-10 NOTE — Progress Notes (Signed)
Pre visit review using our clinic review tool, if applicable. No additional management support is needed unless otherwise documented below in the visit note. 

## 2013-11-10 NOTE — Patient Instructions (Addendum)
Start Cipro for urinary tract infection.  Increase fluid intake.  We will set up evaluation with ENT to remove ear wax.  Call immediately if any concerns or worsening symptoms, fever.

## 2013-11-10 NOTE — Telephone Encounter (Signed)
Abdominal pain for a week, below naval, this morning had blood when she wiped after using bathroom and discolored urine. Advised pt that she will need to be seen.

## 2013-11-10 NOTE — Assessment & Plan Note (Signed)
Symptoms and UA consistent with UTI. Will start Cipro. Urine sent for culture. Follow prn if symptoms are not improving.

## 2013-11-10 NOTE — Progress Notes (Signed)
   Subjective:    Patient ID: Debra Hubbard, female    DOB: [birth year], 80 y.o.   MRN: [id number]  HPI 80YO female presents for acute visit.  Dysuria - Starting about 1 week ago. Much worse today. Noted some dark blood in urine. Some chills today, but no fever. Some increased frequency of urination.  Review of Systems  Constitutional: Negative for fever, chills and fatigue.  Gastrointestinal: Negative for nausea, vomiting, abdominal pain, diarrhea, constipation and rectal pain.  Genitourinary: Positive for dysuria, hematuria and pelvic pain. Negative for urgency, frequency, flank pain, decreased urine volume, vaginal bleeding, vaginal discharge, difficulty urinating and vaginal pain.       Objective:    BP 108/62  Pulse 66  Temp(Src) 98 F (36.7 C) (Oral)  Ht 5\' 2"  (1.575 m)  Wt 118 lb (53.524 kg)  BMI 21.58 kg/m2  SpO2 95% Physical Exam  Constitutional: She is oriented to person, place, and time. She appears well-developed and well-nourished. No distress.  HENT:  Head: Normocephalic and atraumatic.  Right Ear: External ear normal. Decreased hearing is noted.  Left Ear: External ear normal. Decreased hearing is noted.  Nose: Nose normal.  Mouth/Throat: Oropharynx is clear and moist. No oropharyngeal exudate.  Bilateral cerumen impaction  Eyes: Conjunctivae are normal. Pupils are equal, round, and reactive to light. Right eye exhibits no discharge. Left eye exhibits no discharge. No scleral icterus.  Neck: Normal range of motion. Neck supple. No tracheal deviation present. No thyromegaly present.  Cardiovascular: Normal rate, regular rhythm, normal heart sounds and intact distal pulses.  Exam reveals no gallop and no friction rub.   No murmur heard. Pulmonary/Chest: Effort normal and breath sounds normal. No accessory muscle usage. Not tachypneic. No respiratory distress. She has no decreased breath sounds. She has no wheezes. She has no rhonchi. She has no rales. She exhibits  no tenderness.  Abdominal: There is tenderness (suprapubic).  Musculoskeletal: Normal range of motion. She exhibits no edema and no tenderness.  Lymphadenopathy:    She has no cervical adenopathy.  Neurological: She is alert and oriented to person, place, and time. No cranial nerve deficit. She exhibits normal muscle tone. Coordination normal.  Skin: Skin is warm and dry. No rash noted. She is not diaphoretic. No erythema. No pallor.  Psychiatric: She has a normal mood and affect. Her behavior is normal. Judgment and thought content normal.          Assessment & Plan:   Problem List Items Addressed This Visit     Unprioritized   Cerumen impaction     No improvement with water lavage. Will set up ENT evaluation for removal.    Relevant Orders      Ambulatory referral to ENT   Dysuria   Relevant Orders      POCT Urinalysis Dipstick (Completed)   UTI (urinary tract infection) - Primary     Symptoms and UA consistent with UTI. Will start Cipro. Urine sent for culture. Follow prn if symptoms are not improving.    Relevant Medications      ciprofloxacin (CIPRO) tablet   Other Relevant Orders      CULTURE, URINE COMPREHENSIVE       Return in about 4 weeks (around 12/08/2013) for Wellness Visit.

## 2013-11-10 NOTE — Assessment & Plan Note (Signed)
No improvement with water lavage. Will set up ENT evaluation for removal.

## 2013-12-21 ENCOUNTER — Encounter: Payer: Self-pay | Admitting: Internal Medicine

## 2013-12-21 ENCOUNTER — Ambulatory Visit (INDEPENDENT_AMBULATORY_CARE_PROVIDER_SITE_OTHER): Payer: Medicare Other | Admitting: Internal Medicine

## 2013-12-21 ENCOUNTER — Other Ambulatory Visit: Payer: Medicare Other

## 2013-12-21 VITALS — BP 110/66 | HR 63 | Temp 97.7°F | Ht 62.0 in | Wt 116.2 lb

## 2013-12-21 DIAGNOSIS — Z23 Encounter for immunization: Secondary | ICD-10-CM

## 2013-12-21 DIAGNOSIS — Z0000 Encounter for general adult medical examination without abnormal findings: Secondary | ICD-10-CM

## 2013-12-21 DIAGNOSIS — Z136 Encounter for screening for cardiovascular disorders: Secondary | ICD-10-CM

## 2013-12-21 LAB — HM MAMMOGRAPHY

## 2013-12-21 LAB — LIPID PANEL
CHOL/HDL RATIO: 4
Cholesterol: 180 mg/dL (ref 0–200)
HDL: 46.4 mg/dL (ref >39.00)
LDL Cholesterol: 112 mg/dL — ABNORMAL HIGH (ref 0–99)
NonHDL: 133.6
Triglycerides: 109 mg/dL (ref 0.0–149.0)
VLDL: 21.8 mg/dL (ref 0.0–40.0)

## 2013-12-21 LAB — COMPREHENSIVE METABOLIC PANEL
ALT: 17 U/L (ref 0–35)
AST: 20 U/L (ref 0–37)
Albumin: 4.1 g/dL (ref 3.5–5.2)
Alkaline Phosphatase: 75 U/L (ref 39–117)
BUN: 13 mg/dL (ref 6–23)
CALCIUM: 9.7 mg/dL (ref 8.4–10.5)
CHLORIDE: 98 meq/L (ref 96–112)
CO2: 30 meq/L (ref 19–32)
Creatinine, Ser: 0.6 mg/dL (ref 0.4–1.2)
GFR: 107.2 mL/min (ref >60.00)
Glucose, Bld: 90 mg/dL (ref 70–99)
POTASSIUM: 4.3 meq/L (ref 3.5–5.1)
SODIUM: 137 meq/L (ref 135–145)
TOTAL PROTEIN: 7.6 g/dL (ref 6.0–8.3)
Total Bilirubin: 0.8 mg/dL (ref 0.2–1.2)

## 2013-12-21 LAB — CBC WITH DIFFERENTIAL/PLATELET
BASOS ABS: 0.1 10*3/uL (ref 0.0–0.1)
Basophils Relative: 0.6 % (ref 0.0–3.0)
EOS ABS: 0.1 10*3/uL (ref 0.0–0.7)
Eosinophils Relative: 1.6 % (ref 0.0–5.0)
HCT: 41.6 % (ref 36.0–46.0)
HEMOGLOBIN: 14 g/dL (ref 12.0–15.0)
LYMPHS PCT: 31.2 % (ref 12.0–46.0)
Lymphs Abs: 2.7 10*3/uL (ref 0.7–4.0)
MCHC: 33.6 g/dL (ref 30.0–36.0)
MCV: 89.6 fl (ref 78.0–100.0)
MONOS PCT: 10.3 % (ref 3.0–12.0)
Monocytes Absolute: 0.9 10*3/uL (ref 0.1–1.0)
Neutro Abs: 4.8 10*3/uL (ref 1.4–7.7)
Neutrophils Relative %: 56.3 % (ref 43.0–77.0)
Platelets: 210 10*3/uL (ref 150.0–400.0)
RBC: 4.64 Mil/uL (ref 3.87–5.11)
RDW: 14 % (ref 11.5–15.5)
WBC: 8.6 10*3/uL (ref 4.0–10.5)

## 2013-12-21 LAB — HM COLONOSCOPY

## 2013-12-21 LAB — HM PAP SMEAR

## 2013-12-21 NOTE — Progress Notes (Signed)
Subjective:    Patient ID: Debra Hubbard, female    DOB: 1926/08/01, 80 y.o.   MRN: [id number]  HPI The patient is here for annual Medicare wellness examination and management of other chronic and acute problems.   The risk factors are reflected in the social history.  The roster of all physicians providing medical care to patient - is listed in the Snapshot section of the chart.  Activities of daily living:  The patient is 100% independent in all ADLs: dressing, toileting, feeding as well as independent mobility. Lives in one story home. No pets.  Home safety : The patient has smoke detectors in the home. They wear seatbelts.  There are no firearms at home. There is no violence in the home.   There is no risks for hepatitis, STDs or HIV. There is no history of blood transfusion. They have no travel history to infectious disease endemic areas of the world.  The patient has seen their dentist in the last six month. Dentist - Dr. Modesto Charon They have seen their eye doctor in the last year. Opthalmology - [facility] They have deferred audiologic testing in the last year.   They do not  have excessive sun exposure. Discussed the need for sun protection: hats, long sleeves and use of sunscreen if there is significant sun exposure.   Diet: the importance of a healthy diet is discussed. They do have a healthy diet.  The benefits of regular aerobic exercise were discussed. She keeps busy with housework and walks occasionally.  Depression screen: there are no signs or vegative symptoms of depression- irritability, change in appetite, anhedonia, sadness/tearfullness.  Cognitive assessment: the patient manages all their financial and personal affairs and is actively engaged. They could relate day,date,year and events.  HCPOA - Reece Packer, RN.  The following portions of the patient's history were reviewed and updated as appropriate: allergies, current medications, past family history,  past medical history,  past surgical history, past social history  and problem list.  Visual acuity was not assessed per patient preference since she has regular follow up with her ophthalmologist. Hearing and body mass index were assessed and reviewed.   During the course of the visit the patient was educated and counseled about appropriate screening and preventive services including : fall prevention , diabetes screening, nutrition counseling, colorectal cancer screening, and recommended immunizations.     Review of Systems  Constitutional: Negative for fever, chills, appetite change, fatigue and unexpected weight change.  Eyes: Negative for visual disturbance.  Respiratory: Negative for shortness of breath.   Cardiovascular: Negative for chest pain and leg swelling.  Gastrointestinal: Negative for nausea, vomiting, abdominal pain, diarrhea, constipation and blood in stool.  Musculoskeletal: Negative for arthralgias and myalgias.  Skin: Negative for color change and rash.  Hematological: Negative for adenopathy. Does not bruise/bleed easily.  Psychiatric/Behavioral: Negative for sleep disturbance and dysphoric mood. The patient is not nervous/anxious.        Objective:    BP 110/66  Pulse 63  Temp(Src) 97.7 F (36.5 C) (Oral)  Ht 5\' 2"  (1.575 m)  Wt 116 lb 4 oz (52.731 kg)  BMI 21.26 kg/m2  SpO2 93% Physical Exam  Constitutional: She is oriented to person, place, and time. She appears well-developed and well-nourished. No distress.  HENT:  Head: Normocephalic and atraumatic.  Right Ear: External ear normal.  Left Ear: External ear normal.  Nose: Nose normal.  Mouth/Throat: Oropharynx is clear and moist. No oropharyngeal exudate.  Eyes: Conjunctivae and EOM are normal. Pupils are equal, round, and reactive to light. Right eye exhibits no discharge.  Neck: Normal range of motion. Neck supple. No thyromegaly present.  Cardiovascular: Normal rate, regular rhythm, normal heart  sounds and intact distal pulses.  Exam reveals no gallop and no friction rub.   No murmur heard. Pulmonary/Chest: Effort normal. No respiratory distress. She has no wheezes. She has no rales.  Abdominal: Soft. Bowel sounds are normal. She exhibits no distension and no mass. There is no tenderness. There is no rebound and no guarding.  Musculoskeletal: Normal range of motion. She exhibits no edema and no tenderness.  Lymphadenopathy:    She has no cervical adenopathy.  Neurological: She is alert and oriented to person, place, and time. No cranial nerve deficit. Coordination normal.  Skin: Skin is warm and dry. No rash noted. She is not diaphoretic. No erythema. No pallor.  Psychiatric: She has a normal mood and affect. Her behavior is normal. Judgment and thought content normal.          Assessment & Plan:   Problem List Items Addressed This Visit     Unprioritized   Medicare annual wellness visit, subsequent - Primary       No Follow-up on file.

## 2013-12-21 NOTE — Patient Instructions (Signed)

## 2013-12-21 NOTE — Assessment & Plan Note (Signed)
General medical exam normal today. Health maintenance UTD except for mammogram, last 2009, pt declines further. Immunizations UTD, except Prevnar which was given today. Encouraged continued healthy diet and regular physical activity. Follow up 1 year and prn.

## 2013-12-21 NOTE — Progress Notes (Signed)
Pre visit review using our clinic review tool, if applicable. No additional management support is needed unless otherwise documented below in the visit note. 

## 2014-02-25 ENCOUNTER — Ambulatory Visit (INDEPENDENT_AMBULATORY_CARE_PROVIDER_SITE_OTHER): Payer: Medicare Other

## 2014-02-25 DIAGNOSIS — Z23 Encounter for immunization: Secondary | ICD-10-CM

## 2014-11-30 ENCOUNTER — Encounter: Payer: Self-pay | Admitting: Internal Medicine

## 2014-11-30 ENCOUNTER — Ambulatory Visit (INDEPENDENT_AMBULATORY_CARE_PROVIDER_SITE_OTHER): Payer: Medicare Other | Admitting: Internal Medicine

## 2014-11-30 VITALS — BP 106/59 | HR 62 | Temp 97.6°F | Ht 62.0 in | Wt 115.1 lb

## 2014-11-30 DIAGNOSIS — J309 Allergic rhinitis, unspecified: Secondary | ICD-10-CM

## 2014-11-30 DIAGNOSIS — R5382 Chronic fatigue, unspecified: Secondary | ICD-10-CM | POA: Insufficient documentation

## 2014-11-30 DIAGNOSIS — N904 Leukoplakia of vulva: Secondary | ICD-10-CM

## 2014-11-30 LAB — COMPREHENSIVE METABOLIC PANEL
ALK PHOS: 77 U/L (ref 39–117)
ALT: 13 U/L (ref 0–35)
AST: 19 U/L (ref 0–37)
Albumin: 4.3 g/dL (ref 3.5–5.2)
BUN: 11 mg/dL (ref 6–23)
CO2: 32 mEq/L (ref 19–32)
Calcium: 10 mg/dL (ref 8.4–10.5)
Chloride: 100 mEq/L (ref 96–112)
Creatinine, Ser: 0.59 mg/dL (ref 0.40–1.20)
GFR: 102.79 mL/min (ref >60.00)
GLUCOSE: 88 mg/dL (ref 70–99)
Potassium: 4.1 mEq/L (ref 3.5–5.1)
SODIUM: 138 meq/L (ref 135–145)
Total Bilirubin: 0.5 mg/dL (ref 0.2–1.2)
Total Protein: 7.7 g/dL (ref 6.0–8.3)

## 2014-11-30 LAB — CBC WITH DIFFERENTIAL/PLATELET
BASOS PCT: 0.5 % (ref 0.0–3.0)
Basophils Absolute: 0 10*3/uL (ref 0.0–0.1)
Eosinophils Absolute: 0.2 10*3/uL (ref 0.0–0.7)
Eosinophils Relative: 1.9 % (ref 0.0–5.0)
HCT: 42.7 % (ref 36.0–46.0)
HEMOGLOBIN: 14.3 g/dL (ref 12.0–15.0)
LYMPHS ABS: 2.7 10*3/uL (ref 0.7–4.0)
Lymphocytes Relative: 32.6 % (ref 12.0–46.0)
MCHC: 33.4 g/dL (ref 30.0–36.0)
MCV: 90.3 fl (ref 78.0–100.0)
MONO ABS: 0.9 10*3/uL (ref 0.1–1.0)
Monocytes Relative: 11 % (ref 3.0–12.0)
NEUTROS ABS: 4.5 10*3/uL (ref 1.4–7.7)
Neutrophils Relative %: 54 % (ref 43.0–77.0)
PLATELETS: 216 10*3/uL (ref 150.0–400.0)
RBC: 4.73 Mil/uL (ref 3.87–5.11)
RDW: 13.9 % (ref 11.5–15.5)
WBC: 8.3 10*3/uL (ref 4.0–10.5)

## 2014-11-30 LAB — VITAMIN B12: VITAMIN B 12: 211 pg/mL (ref 211–911)

## 2014-11-30 LAB — TSH: TSH: 1.21 u[IU]/mL (ref 0.35–4.50)

## 2014-11-30 MED ORDER — CLOBETASOL PROPIONATE 0.05 % EX OINT: 1.0000 "application " | TOPICAL_OINTMENT | Freq: Two times a day (BID) | CUTANEOUS | Status: DC

## 2014-11-30 NOTE — Progress Notes (Signed)
Subjective:    Patient ID: Debra Hubbard, female    DOB: 02-04-1927, 81 y.o.   MRN: [id number]  HPI  81YO female presents for follow up.  Congestion - Chronic clear drainage and sneezing. Not taking any medication for this. Last week had worsening congestion and some bloody drainage. No recent fever, chills.  Feeling more tired recently. No focal symptoms aside from congestion. Appetite good. Prepares her own meals.  Wt Readings from Last 3 Encounters:  11/30/14 115 lb 2 oz (52.22 kg)  12/21/13 116 lb 4 oz (52.731 kg)  11/10/13 118 lb (53.524 kg)   Rash over vaginal area. Very itchy. No urinary symptoms. Has not used anything for this.  Past medical, surgical, family and social history per today's encounter.  Review of Systems  Constitutional: Positive for fatigue. Negative for fever, chills, appetite change and unexpected weight change.  Eyes: Negative for visual disturbance.  Respiratory: Negative for shortness of breath.   Cardiovascular: Negative for chest pain and leg swelling.  Gastrointestinal: Negative for nausea, vomiting, abdominal pain, diarrhea and constipation.  Genitourinary: Positive for vaginal pain. Negative for vaginal bleeding and vaginal discharge.  Skin: Negative for color change and rash.  Hematological: Negative for adenopathy. Does not bruise/bleed easily.  Psychiatric/Behavioral: Negative for dysphoric mood. The patient is not nervous/anxious.        Objective:    BP 106/59 mmHg  Pulse 62  Temp(Src) 97.6 F (36.4 C) (Oral)  Ht 5\' 2"  (1.575 m)  Wt 115 lb 2 oz (52.22 kg)  BMI 21.05 kg/m2  SpO2 95% Physical Exam  Constitutional: She is oriented to person, place, and time. She appears well-developed and well-nourished. No distress.  HENT:  Head: Normocephalic and atraumatic.  Right Ear: External ear normal.  Left Ear: External ear normal.  Nose: Nose normal.  Mouth/Throat: Oropharynx is clear and moist. No oropharyngeal exudate.  Eyes:  Conjunctivae are normal. Pupils are equal, round, and reactive to light. Right eye exhibits no discharge. Left eye exhibits no discharge. No scleral icterus.  Neck: Normal range of motion. Neck supple. No tracheal deviation present. No thyromegaly present.  Cardiovascular: Normal rate, regular rhythm, normal heart sounds and intact distal pulses.  Exam reveals no gallop and no friction rub.   No murmur heard. Pulmonary/Chest: Effort normal and breath sounds normal. No respiratory distress. She has no wheezes. She has no rales. She exhibits no tenderness.  Genitourinary:    There is rash on the right labia. There is rash on the left labia.  Musculoskeletal: Normal range of motion. She exhibits no edema or tenderness.  Lymphadenopathy:    She has no cervical adenopathy.  Neurological: She is alert and oriented to person, place, and time. No cranial nerve deficit. She exhibits normal muscle tone. Coordination normal.  Skin: Skin is warm and dry. No rash noted. She is not diaphoretic. No erythema. No pallor.  Psychiatric: She has a normal mood and affect. Her behavior is normal. Judgment and thought content normal.          Assessment & Plan:   Problem List Items Addressed This Visit      Unprioritized   Allergic rhinitis    Chronic allergic rhinitis. She declines referral back to ENT. Will start Claritin daily to see if any improvement.      Chronic fatigue - Primary    Chronic mild fatigue. Likely multifactorial. Will check labs including CBC, CMP, TSH, and B12.      Relevant Orders  CBC with Differential/Platelet   Comprehensive metabolic panel   TSH   B12   Lichen sclerosus et atrophicus of the vulva    Exam is consistent with lichen sclerosis. Will start topical Clobetasol. Follow up to recheck in 2-4 weeks.          Return in about 4 weeks (around 12/28/2014) for Recheck.

## 2014-11-30 NOTE — Progress Notes (Signed)
Pre visit review using our clinic review tool, if applicable. No additional management support is needed unless otherwise documented below in the visit note. 

## 2014-11-30 NOTE — Assessment & Plan Note (Signed)
Chronic mild fatigue. Likely multifactorial. Will check labs including CBC, CMP, TSH, and B12.

## 2014-11-30 NOTE — Patient Instructions (Signed)
Consider starting Claritin 10mg  daily to help with allergies.  Start Clobetasol ointment to vaginal area 1-2 times daily.  Lichen Sclerosus Lichen sclerosus is a skin problem. It can happen on any part of the body, but it commonly involves the anal or genital areas. Lichen sclerosus is not an infection or a fungus. Girls and women are more commonly affected than boys and men. CAUSES The cause is not known. It could be the result of an overactive immune system or a lack of certain hormones. Lichen sclerosus is not passed from one person to another (not contagious). SYMPTOMS Your skin may have:  Thin, wrinkled, white areas.  Thickened white areas.  Red and swollen patches.  Tears or cracks.  Bruising.  Blood blisters.  Severe itching. You may also have pain, itching, or burning with urination. Constipation is also common in people with lichen sclerosus. DIAGNOSIS Your caregiver will do a physical exam. Sometimes, a tissue sample (biopsy) may be sent for testing. TREATMENT Treatment may involve putting a thin layer of medicated cream (topical steroid) over the areas with lichen sclerosus. Use the cream only as directed by your caregiver.  HOME CARE INSTRUCTIONS  Only take over-the-counter or prescription medicines as directed by your caregiver.  Keep the vaginal area as clean and dry as possible. SEEK MEDICAL CARE IF: You develop increasing pain, swelling, or redness. Document Released: 09/19/2010 Document Revised: 07/22/2011 Document Reviewed: 09/19/2010 [facility] Patient Information 2015 [place], [place]. This information is not intended to replace advice given to you by your health care provider. Make sure you discuss any questions you have with your health care provider.

## 2014-11-30 NOTE — Assessment & Plan Note (Signed)
Chronic allergic rhinitis. She declines referral back to ENT. Will start Claritin daily to see if any improvement.

## 2014-11-30 NOTE — Assessment & Plan Note (Signed)
Exam is consistent with lichen sclerosis. Will start topical Clobetasol. Follow up to recheck in 2-4 weeks.

## 2014-12-08 ENCOUNTER — Ambulatory Visit (INDEPENDENT_AMBULATORY_CARE_PROVIDER_SITE_OTHER): Payer: Medicare Other | Admitting: *Deleted

## 2014-12-08 DIAGNOSIS — E538 Deficiency of other specified B group vitamins: Secondary | ICD-10-CM | POA: Diagnosis not present

## 2014-12-08 MED ORDER — CYANOCOBALAMIN 1000 MCG/ML IJ SOLN
1000.0000 ug | Freq: Once | INTRAMUSCULAR | Status: AC
  Administered 2014-12-08: 1000 ug via INTRAMUSCULAR

## 2014-12-20 ENCOUNTER — Ambulatory Visit (INDEPENDENT_AMBULATORY_CARE_PROVIDER_SITE_OTHER): Payer: Medicare Other | Admitting: *Deleted

## 2014-12-20 DIAGNOSIS — R5382 Chronic fatigue, unspecified: Secondary | ICD-10-CM

## 2014-12-20 MED ORDER — CYANOCOBALAMIN 1000 MCG/ML IJ SOLN
1000.0000 ug | Freq: Once | INTRAMUSCULAR | Status: AC
  Administered 2014-12-20: 1000 ug via INTRAMUSCULAR

## 2014-12-21 ENCOUNTER — Ambulatory Visit: Payer: Medicare Other

## 2015-01-02 ENCOUNTER — Encounter: Payer: Self-pay | Admitting: Internal Medicine

## 2015-01-02 ENCOUNTER — Ambulatory Visit (INDEPENDENT_AMBULATORY_CARE_PROVIDER_SITE_OTHER): Payer: Medicare Other | Admitting: Internal Medicine

## 2015-01-02 VITALS — BP 120/68 | HR 62 | Temp 98.2°F | Ht 62.0 in | Wt 112.5 lb

## 2015-01-02 DIAGNOSIS — R5382 Chronic fatigue, unspecified: Secondary | ICD-10-CM

## 2015-01-02 DIAGNOSIS — M791 Myalgia: Secondary | ICD-10-CM

## 2015-01-02 DIAGNOSIS — N904 Leukoplakia of vulva: Secondary | ICD-10-CM | POA: Diagnosis not present

## 2015-01-02 DIAGNOSIS — Z23 Encounter for immunization: Secondary | ICD-10-CM

## 2015-01-02 DIAGNOSIS — M609 Myositis, unspecified: Secondary | ICD-10-CM

## 2015-01-02 DIAGNOSIS — H9190 Unspecified hearing loss, unspecified ear: Secondary | ICD-10-CM | POA: Insufficient documentation

## 2015-01-02 DIAGNOSIS — IMO0001 Reserved for inherently not codable concepts without codable children: Secondary | ICD-10-CM | POA: Insufficient documentation

## 2015-01-02 DIAGNOSIS — H9193 Unspecified hearing loss, bilateral: Secondary | ICD-10-CM

## 2015-01-02 MED ORDER — CYANOCOBALAMIN 1000 MCG/ML IJ SOLN
1000.0000 ug | Freq: Once | INTRAMUSCULAR | Status: AC
  Administered 2015-01-02: 1000 ug via INTRAMUSCULAR

## 2015-01-02 NOTE — Assessment & Plan Note (Signed)
Nighttime muscle cramps in feet.  Recent electrolytes were normal. Will add OTC magnesium to see if any improvement. Also supplementing B12 which may help. Follow up in 3 months and prn.

## 2015-01-02 NOTE — Addendum Note (Signed)
Addended by: Marchia Meiers on: 01/02/2015 03:10 PM   Modules accepted: Orders

## 2015-01-02 NOTE — Assessment & Plan Note (Signed)
Symptoms resolved after treatment with Clobetasol. Will monitor for any recurrent symptoms.

## 2015-01-02 NOTE — Assessment & Plan Note (Signed)
Fatigue improved somewhat with B12 supplementation. Will continue to monitor.

## 2015-01-02 NOTE — Progress Notes (Signed)
Pre visit review using our clinic review tool, if applicable. No additional management support is needed unless otherwise documented below in the visit note. 

## 2015-01-02 NOTE — Progress Notes (Signed)
Subjective:    Patient ID: Debra Hubbard, female    DOB: 1927-02-25, 81 y.o.   MRN: [id number]  HPI  81YO female presents for follow up.  Last seen 11/2014 for fatigue. Found to have low B12. Started on supplementation. Also noted to have lichen sclerosis on exam. Started on Clobetasol.  Lichen sclerosis - itching has resolved with use of Clobetasol. Completed 2 weeks and plans to stop medication.  Muscle cramps - Having some muscle cramps in feet at night.  Located mostly over tops of feet. No improvement with B12 injections. Not taking anything for this.  Hearing loss - Over last few weeks, hearing loss seems to be worse right>left ear. No pain noted. Chronic ear wax. Occasional allergy symptoms with nasal congestion, sneezing. She questions if this may be contributing.  Fatigue - Improved with starting B12 shots, however has some days that are generally better than others. No focal symptoms.  Past Medical History  Diagnosis Date  . Macular degeneration   . Cataracts, bilateral   . Squamous cell carcinoma     face, s/p excision  . Degenerative joint disease   . Gastritis     followed by Dr. Bluford Kaufmann   Family History  Problem Relation Age of Onset  . Cancer Neg Hx   . Heart disease Neg Hx    Past Surgical History  Procedure Laterality Date  . Appendectomy  1957  . Oophorectomy  1957  . Exploratory laparotomy  1963  . Breast biopsy      x 3, all normal   Social History   Social History  . Marital Status: Widowed    Spouse Name: N/A  . Number of Children: N/A  . Years of Education: N/A   Social History Main Topics  . Smoking status: Former Smoker    Quit date: 08/20/1984  . Smokeless tobacco: Never Used  . Alcohol Use: No  . Drug Use: No  . Sexual Activity: Not Asked   Other Topics Concern  . None   Social History Narrative    Review of Systems  Constitutional: Positive for fatigue. Negative for fever, chills, appetite change and unexpected weight change.    HENT: Positive for hearing loss. Negative for ear discharge and ear pain.   Eyes: Negative for visual disturbance.  Respiratory: Negative for shortness of breath.   Cardiovascular: Negative for chest pain and leg swelling.  Gastrointestinal: Negative for nausea, vomiting, abdominal pain, diarrhea and constipation.  Genitourinary: Negative for genital sores and vaginal pain.  Musculoskeletal: Positive for myalgias. Negative for arthralgias.  Skin: Negative for color change and rash.  Neurological: Negative for weakness and numbness.  Hematological: Negative for adenopathy. Does not bruise/bleed easily.  Psychiatric/Behavioral: Negative for sleep disturbance and dysphoric mood. The patient is not nervous/anxious.        Objective:    BP 120/68 mmHg  Pulse 62  Temp(Src) 98.2 F (36.8 C) (Oral)  Ht 5\' 2"  (1.575 m)  Wt 112 lb 8 oz (51.03 kg)  BMI 20.57 kg/m2  SpO2 95% Physical Exam  Constitutional: She is oriented to person, place, and time. She appears well-developed and well-nourished. No distress.  HENT:  Head: Normocephalic and atraumatic.  Right Ear: External ear normal. Decreased hearing is noted.  Left Ear: External ear normal. Decreased hearing is noted.  Nose: Nose normal.  Mouth/Throat: Oropharynx is clear and moist. No oropharyngeal exudate.  Bilateral ear canals with cerumen up against TM.  Eyes: Conjunctivae are normal. Pupils are equal,  round, and reactive to light. Right eye exhibits no discharge. Left eye exhibits no discharge. No scleral icterus.  Neck: Normal range of motion. Neck supple. No tracheal deviation present. No thyromegaly present.  Cardiovascular: Normal rate, regular rhythm, normal heart sounds and intact distal pulses.  Exam reveals no gallop and no friction rub.   No murmur heard. Pulmonary/Chest: Effort normal and breath sounds normal. No respiratory distress. She has no wheezes. She has no rales. She exhibits no tenderness.  Musculoskeletal:  Normal range of motion. She exhibits no edema or tenderness.  Lymphadenopathy:    She has no cervical adenopathy.  Neurological: She is alert and oriented to person, place, and time. No cranial nerve deficit. She exhibits normal muscle tone. Coordination normal.  Skin: Skin is warm and dry. No rash noted. She is not diaphoretic. No erythema. No pallor.  Psychiatric: She has a normal mood and affect. Her behavior is normal. Judgment and thought content normal.          Assessment & Plan:   Problem List Items Addressed This Visit      Unprioritized   Chronic fatigue - Primary    Fatigue improved somewhat with B12 supplementation. Will continue to monitor.      Hearing loss    Bilateral hearing loss. Small amount of cerumen up against TM bilaterally. ENT evaluation pending for Thursday. Will follow.      Lichen sclerosus et atrophicus of the vulva    Symptoms resolved after treatment with Clobetasol. Will monitor for any recurrent symptoms.      Myalgia and myositis    Nighttime muscle cramps in feet.  Recent electrolytes were normal. Will add OTC magnesium to see if any improvement. Also supplementing B12 which may help. Follow up in 3 months and prn.          Return in about 3 months (around 04/04/2015) for Recheck.

## 2015-01-02 NOTE — Assessment & Plan Note (Signed)
Bilateral hearing loss. Small amount of cerumen up against TM bilaterally. ENT evaluation pending for Thursday. Will follow.

## 2015-01-02 NOTE — Patient Instructions (Addendum)
Continue monthly B12 shots.  Consider adding an over the counter Magnesium supplement if cramping continues.  Follow up in 3 months or sooner as needed.

## 2015-01-05 ENCOUNTER — Other Ambulatory Visit: Payer: Self-pay | Admitting: Otolaryngology

## 2015-01-05 DIAGNOSIS — H903 Sensorineural hearing loss, bilateral: Secondary | ICD-10-CM

## 2015-01-05 DIAGNOSIS — H905 Unspecified sensorineural hearing loss: Secondary | ICD-10-CM

## 2015-01-12 ENCOUNTER — Ambulatory Visit
Admission: RE | Admit: 2015-01-12 | Discharge: 2015-01-12 | Disposition: A | Discharge status: Home / Self Care | Payer: Medicare Other | Source: Ambulatory Visit | Attending: Otolaryngology | Admitting: Otolaryngology

## 2015-01-12 DIAGNOSIS — H905 Unspecified sensorineural hearing loss: Secondary | ICD-10-CM

## 2015-01-12 DIAGNOSIS — H903 Sensorineural hearing loss, bilateral: Secondary | ICD-10-CM

## 2015-01-12 MED ORDER — GADOBENATE DIMEGLUMINE 529 MG/ML IV SOLN
10.0000 mL | Freq: Once | INTRAVENOUS | Status: AC | PRN
  Administered 2015-01-12: 10 mL via INTRAVENOUS

## 2015-04-04 ENCOUNTER — Encounter: Payer: Self-pay | Admitting: Internal Medicine

## 2015-04-04 ENCOUNTER — Ambulatory Visit (INDEPENDENT_AMBULATORY_CARE_PROVIDER_SITE_OTHER): Payer: Medicare Other | Admitting: Internal Medicine

## 2015-04-04 VITALS — BP 121/64 | HR 58 | Temp 97.6°F | Ht 62.0 in | Wt 117.4 lb

## 2015-04-04 DIAGNOSIS — R2681 Unsteadiness on feet: Secondary | ICD-10-CM

## 2015-04-04 DIAGNOSIS — M791 Myalgia: Secondary | ICD-10-CM

## 2015-04-04 DIAGNOSIS — J309 Allergic rhinitis, unspecified: Secondary | ICD-10-CM

## 2015-04-04 DIAGNOSIS — M609 Myositis, unspecified: Secondary | ICD-10-CM

## 2015-04-04 DIAGNOSIS — IMO0001 Reserved for inherently not codable concepts without codable children: Secondary | ICD-10-CM

## 2015-04-04 MED ORDER — CLOBETASOL PROPIONATE 0.05 % EX OINT: 1.0000 "application " | TOPICAL_OINTMENT | Freq: Two times a day (BID) | CUTANEOUS | Status: DC

## 2015-04-04 MED ORDER — IPRATROPIUM BROMIDE 0.03 % NA SOLN: 2.0000 | Freq: Two times a day (BID) | NASAL | Status: DC

## 2015-04-04 NOTE — Patient Instructions (Signed)
Continue current medications.  Consider strength training, such as classes at the [facility].

## 2015-04-04 NOTE — Progress Notes (Signed)
Pre visit review using our clinic review tool, if applicable. No additional management support is needed unless otherwise documented below in the visit note. 

## 2015-04-04 NOTE — Assessment & Plan Note (Signed)
Pt notes some gait instability at times and fear of falling. Recommended PT referral for strength training and falls prevention. She declines. Also recommended DEXA scan, however she declines. Encouraged her to participate in exercise through the [facility].

## 2015-04-04 NOTE — Progress Notes (Signed)
Subjective:    Patient ID: Debra Hubbard, female    DOB: 06-14-1926, 81 y.o.   MRN: [id number]  HPI  81YO female presents for follow up.  Last seen in 12/2014 for fatigue. Fatigue has improved some.  Muscle cramping in legs has improved. Also taking Magnesium at night.  Worried about falls risk. However, reports she is doing well. Completes all of her meal prep and cleaning in her apartment. Has not had any falls. Does not want to participate in strengthening exercises.    Also notes that she has chronic clear rhinorrhea. Occasional sneezing. Has tried OTC meds with no improvement.   Wt Readings from Last 3 Encounters:  04/04/15 117 lb 6 oz (53.241 kg)  01/12/15 112 lb (50.803 kg)  01/02/15 112 lb 8 oz (51.03 kg)   BP Readings from Last 3 Encounters:  04/04/15 121/64  01/02/15 120/68  11/30/14 106/59    Past Medical History  Diagnosis Date  . Macular degeneration   . Cataracts, bilateral   . Squamous cell carcinoma (HCC)     face, s/p excision  . Degenerative joint disease   . Gastritis     followed by Dr. Bluford Kaufmann   Family History  Problem Relation Age of Onset  . Cancer Neg Hx   . Heart disease Neg Hx    Past Surgical History  Procedure Laterality Date  . Appendectomy  1957  . Oophorectomy  1957  . Exploratory laparotomy  1963  . Breast biopsy      x 3, all normal   Social History   Social History  . Marital Status: Widowed    Spouse Name: N/A  . Number of Children: N/A  . Years of Education: N/A   Social History Main Topics  . Smoking status: Former Smoker    Quit date: 08/20/1984  . Smokeless tobacco: Never Used  . Alcohol Use: No  . Drug Use: No  . Sexual Activity: Not Asked   Other Topics Concern  . None   Social History Narrative    Review of Systems  Constitutional: Positive for fatigue. Negative for fever, chills, appetite change and unexpected weight change.  HENT: Positive for postnasal drip and rhinorrhea. Negative for congestion,  sinus pressure, sore throat and trouble swallowing.   Eyes: Negative for visual disturbance.  Respiratory: Negative for shortness of breath and wheezing.   Cardiovascular: Negative for chest pain and leg swelling.  Gastrointestinal: Negative for nausea, vomiting, abdominal pain, diarrhea and constipation.  Musculoskeletal: Positive for gait problem (feels unsteady at times). Negative for myalgias and arthralgias.  Skin: Negative for color change and rash.  Neurological: Negative for dizziness and weakness.  Hematological: Negative for adenopathy. Does not bruise/bleed easily.  Psychiatric/Behavioral: Negative for sleep disturbance and dysphoric mood. The patient is not nervous/anxious.        Objective:    BP 121/64 mmHg  Pulse 58  Temp(Src) 97.6 F (36.4 C) (Oral)  Ht 5\' 2"  (1.575 m)  Wt 117 lb 6 oz (53.241 kg)  BMI 21.46 kg/m2  SpO2 96% Physical Exam  Constitutional: She is oriented to person, place, and time. She appears well-developed and well-nourished. No distress.  HENT:  Head: Normocephalic and atraumatic.  Right Ear: External ear normal.  Left Ear: External ear normal.  Nose: Nose normal.  Mouth/Throat: Oropharynx is clear and moist. No oropharyngeal exudate.  Eyes: Conjunctivae are normal. Pupils are equal, round, and reactive to light. Right eye exhibits no discharge. Left eye exhibits no discharge.  No scleral icterus.  Neck: Normal range of motion. Neck supple. No tracheal deviation present. No thyromegaly present.  Cardiovascular: Normal rate, regular rhythm, normal heart sounds and intact distal pulses.  Exam reveals no gallop and no friction rub.   No murmur heard. Pulmonary/Chest: Effort normal and breath sounds normal. No accessory muscle usage. No respiratory distress. She has no decreased breath sounds. She has no wheezes. She has no rhonchi. She has no rales. She exhibits no tenderness.  Musculoskeletal: Normal range of motion. She exhibits no edema or  tenderness.  Lymphadenopathy:    She has no cervical adenopathy.  Neurological: She is alert and oriented to person, place, and time. No cranial nerve deficit. She exhibits normal muscle tone. Coordination normal.  Skin: Skin is warm and dry. No rash noted. She is not diaphoretic. No erythema. No pallor.  Psychiatric: She has a normal mood and affect. Her behavior is normal. Judgment and thought content normal.          Assessment & Plan:   Problem List Items Addressed This Visit      Unprioritized   Allergic rhinitis    Will start Atrovent nasal spray to help with rhinorrhea. Follow up prn if symptoms are not improving.      Relevant Medications   ipratropium (ATROVENT) 0.03 % nasal spray   Gait instability - Primary    Pt notes some gait instability at times and fear of falling. Recommended PT referral for strength training and falls prevention. She declines. Also recommended DEXA scan, however she declines. Encouraged her to participate in exercise through the [facility].      Myalgia and myositis    Muscle cramping has improved with addition of magnesium at night. Will continue.          Return in about 6 months (around 10/02/2015) for Recheck.

## 2015-04-04 NOTE — Assessment & Plan Note (Signed)
Will start Atrovent nasal spray to help with rhinorrhea. Follow up prn if symptoms are not improving.

## 2015-04-04 NOTE — Assessment & Plan Note (Signed)
Muscle cramping has improved with addition of magnesium at night. Will continue.

## 2015-05-10 ENCOUNTER — Encounter: Payer: Self-pay | Admitting: *Deleted

## 2015-10-04 ENCOUNTER — Ambulatory Visit (INDEPENDENT_AMBULATORY_CARE_PROVIDER_SITE_OTHER): Payer: Medicare Other | Admitting: Internal Medicine

## 2015-10-04 ENCOUNTER — Encounter: Payer: Self-pay | Admitting: Internal Medicine

## 2015-10-04 VITALS — BP 130/72 | HR 62 | Ht 62.0 in | Wt 115.0 lb

## 2015-10-04 DIAGNOSIS — H9193 Unspecified hearing loss, bilateral: Secondary | ICD-10-CM

## 2015-10-04 DIAGNOSIS — H9201 Otalgia, right ear: Secondary | ICD-10-CM | POA: Diagnosis not present

## 2015-10-04 DIAGNOSIS — R2681 Unsteadiness on feet: Secondary | ICD-10-CM

## 2015-10-04 DIAGNOSIS — J309 Allergic rhinitis, unspecified: Secondary | ICD-10-CM

## 2015-10-04 DIAGNOSIS — H9209 Otalgia, unspecified ear: Secondary | ICD-10-CM | POA: Insufficient documentation

## 2015-10-04 MED ORDER — IPRATROPIUM BROMIDE 0.03 % NA SOLN: 2.0000 | Freq: Two times a day (BID) | NASAL | Status: DC

## 2015-10-04 NOTE — Assessment & Plan Note (Signed)
Symptoms relatively stable. No recent falls. Will continue to monitor.

## 2015-10-04 NOTE — Progress Notes (Signed)
Subjective:    Patient ID: Debra Hubbard, female    DOB: [birth year], 82 y.o.   MRN: [id number]  HPI  82YO female presents for follow up.  Feeling well. No recent falls. Recently had an ear infection right ear. This was not treated with antibiotics. Felt off balance for a while, but this has resolved.  Recent change in bowel movements. Has some watery stools on occasion. Sometimes, seems related to eating seeds. Plans to eliminate foods with seeds from diet. No bloody or black stool. No abdominal pain.  Also notes some clear nasal drainage. This has been fairly chronic for her. She has not recently been using her nasal spray. No fever, chills. No cough.  Wt Readings from Last 3 Encounters:  10/04/15 115 lb (52.164 kg)  04/04/15 117 lb 6 oz (53.241 kg)  01/12/15 112 lb (50.803 kg)   BP Readings from Last 3 Encounters:  10/04/15 130/72  04/04/15 121/64  01/02/15 120/68    Past Medical History  Diagnosis Date  . Macular degeneration   . Cataracts, bilateral   . Squamous cell carcinoma (HCC)     face, s/p excision  . Degenerative joint disease   . Gastritis     followed by Dr. Bluford Kaufmann   Family History  Problem Relation Age of Onset  . Cancer Neg Hx   . Heart disease Neg Hx    Past Surgical History  Procedure Laterality Date  . Appendectomy  1957  . Oophorectomy  1957  . Exploratory laparotomy  1963  . Breast biopsy      x 3, all normal   Social History   Social History  . Marital Status: Widowed    Spouse Name: N/A  . Number of Children: N/A  . Years of Education: N/A   Social History Main Topics  . Smoking status: Former Smoker    Quit date: 08/20/1984  . Smokeless tobacco: Never Used  . Alcohol Use: No  . Drug Use: No  . Sexual Activity: Not Asked   Other Topics Concern  . None   Social History Narrative    Review of Systems  Constitutional: Negative for fever, chills, appetite change, fatigue and unexpected weight change.  HENT: Positive for  congestion, mouth sores, postnasal drip and rhinorrhea. Negative for sinus pressure, sneezing, sore throat, tinnitus, trouble swallowing and voice change.   Eyes: Negative for visual disturbance.  Respiratory: Negative for shortness of breath.   Cardiovascular: Negative for chest pain and leg swelling.  Gastrointestinal: Positive for diarrhea. Negative for nausea, vomiting, abdominal pain, constipation and abdominal distention.  Skin: Negative for color change and rash.  Hematological: Negative for adenopathy. Does not bruise/bleed easily.  Psychiatric/Behavioral: Negative for sleep disturbance and dysphoric mood. The patient is not nervous/anxious.        Objective:    BP 130/72 mmHg  Pulse 62  Ht 5\' 2"  (1.575 m)  Wt 115 lb (52.164 kg)  BMI 21.03 kg/m2  SpO2 97% Physical Exam  Constitutional: She is oriented to person, place, and time. She appears well-developed and well-nourished. No distress.  HENT:  Head: Normocephalic and atraumatic.  Right Ear: External ear normal.  Left Ear: External ear normal.  Nose: Rhinorrhea present. Right sinus exhibits no maxillary sinus tenderness and no frontal sinus tenderness. Left sinus exhibits no maxillary sinus tenderness and no frontal sinus tenderness.  Mouth/Throat: Oropharynx is clear and moist. No oropharyngeal exudate.  Cerumen obstructing both ear canals   Eyes: Conjunctivae are normal. Pupils are  equal, round, and reactive to light. Right eye exhibits no discharge. Left eye exhibits no discharge. No scleral icterus.  Neck: Normal range of motion. Neck supple. No tracheal deviation present. No thyromegaly present.  Cardiovascular: Normal rate, regular rhythm, normal heart sounds and intact distal pulses.  Exam reveals no gallop and no friction rub.   No murmur heard. Pulmonary/Chest: Effort normal and breath sounds normal. No respiratory distress. She has no wheezes. She has no rales. She exhibits no tenderness.  Musculoskeletal: Normal  range of motion. She exhibits no edema or tenderness.  Lymphadenopathy:    She has no cervical adenopathy.  Neurological: She is alert and oriented to person, place, and time. No cranial nerve deficit. She exhibits normal muscle tone. Coordination normal.  Skin: Skin is warm and dry. No rash noted. She is not diaphoretic. No erythema. No pallor.  Psychiatric: She has a normal mood and affect. Her behavior is normal. Judgment and thought content normal.          Assessment & Plan:   Problem List Items Addressed This Visit      Unprioritized   Allergic rhinitis    Encouraged her to start back using Atrovent nasal spray.       Relevant Medications   ipratropium (ATROVENT) 0.03 % nasal spray   Ear pain    Right ear pain last few weeks. Cerumen pressed up against TM on exam. ENT evaluation pending for tomorrow.      Gait instability - Primary    Symptoms relatively stable. No recent falls. Will continue to monitor.      Hearing loss    Encouraged follow up with ENT for hearing exam and possible hearing aids.          Return in about 6 months (around 04/05/2016) for Physical.  Ronna Polio, MD Internal Medicine [facility]

## 2015-10-04 NOTE — Assessment & Plan Note (Signed)
Encouraged her to start back using Atrovent nasal spray.

## 2015-10-04 NOTE — Assessment & Plan Note (Signed)
Encouraged follow up with ENT for hearing exam and possible hearing aids.

## 2015-10-04 NOTE — Patient Instructions (Addendum)
Follow up with ENT as scheduled.  Keep a food diary to help identify cause of diarrhea.   Start back on Atrovent nasal spray for nasal drainage.

## 2015-10-04 NOTE — Progress Notes (Signed)
Pre visit review using our clinic review tool, if applicable. No additional management support is needed unless otherwise documented below in the visit note. 

## 2015-10-04 NOTE — Assessment & Plan Note (Signed)
Right ear pain last few weeks. Cerumen pressed up against TM on exam. ENT evaluation pending for tomorrow.

## 2015-11-13 ENCOUNTER — Telehealth: Payer: Self-pay | Admitting: *Deleted

## 2015-11-13 NOTE — Telephone Encounter (Signed)
No I am not.

## 2015-11-13 NOTE — Telephone Encounter (Signed)
Pt called back to check and see if Dr Darrick Huntsman will take her. I advised pt of nurse msg. Thank you!

## 2015-11-13 NOTE — Telephone Encounter (Signed)
Dr. Darrick Huntsman, you did see this patient back in 2013.  Please advise if you are taking new patients. thanks

## 2015-11-13 NOTE — Telephone Encounter (Signed)
Per Dr. Darrick Huntsman, see below, she will need to establish with either Rubin Payor or another [employer]. thanks

## 2015-11-13 NOTE — Telephone Encounter (Signed)
Patient has requested to have Dr. Darrick Huntsman as her provider,she stated that she has seen Dr. Darrick Huntsman, she's aware that Dr.Tullo is not accepting new patients at this time.

## 2016-03-13 ENCOUNTER — Encounter: Payer: Self-pay | Admitting: Family Medicine

## 2016-03-13 ENCOUNTER — Ambulatory Visit (INDEPENDENT_AMBULATORY_CARE_PROVIDER_SITE_OTHER): Payer: Medicare Other | Admitting: Family Medicine

## 2016-03-13 VITALS — BP 104/60 | HR 61 | Temp 98.2°F | Wt 116.1 lb

## 2016-03-13 DIAGNOSIS — Z7189 Other specified counseling: Secondary | ICD-10-CM | POA: Insufficient documentation

## 2016-03-13 DIAGNOSIS — Z13820 Encounter for screening for osteoporosis: Secondary | ICD-10-CM | POA: Insufficient documentation

## 2016-03-13 DIAGNOSIS — H3530 Unspecified macular degeneration: Secondary | ICD-10-CM | POA: Diagnosis not present

## 2016-03-13 DIAGNOSIS — Z0000 Encounter for general adult medical examination without abnormal findings: Secondary | ICD-10-CM

## 2016-03-13 DIAGNOSIS — N904 Leukoplakia of vulva: Secondary | ICD-10-CM

## 2016-03-13 DIAGNOSIS — Z23 Encounter for immunization: Secondary | ICD-10-CM

## 2016-03-13 NOTE — Assessment & Plan Note (Signed)
Stable. Continue PRN Clobetasol.

## 2016-03-13 NOTE — Assessment & Plan Note (Signed)
Stable

## 2016-03-13 NOTE — Patient Instructions (Signed)
Call with issues/concerns.  Take care  Dr. Adriana Simas

## 2016-03-13 NOTE — Progress Notes (Signed)
Pre visit review using our clinic review tool, if applicable. No additional management support is needed unless otherwise documented below in the visit note. 

## 2016-03-13 NOTE — Progress Notes (Signed)
   Subjective:  Patient ID: Debra Hubbard, female    DOB: 08/09/1926  Age: 82 y.o. MRN: [id number]  CC: Follow up, establish with me.  HPI:  82 year old female with macular degeneration, hearing loss, lichen sclerosis presents for follow up.  Lichen sclerosis  Stable on PRN clobetasol.  Macular degeneration   Stable.  Followed by ophthalmology.  Preventative health care  Declines Tdap, Zostavax.  Okay with Flu shot.  Declines Dexa.  Social Hx   Social History   Social History  . Marital status: Widowed    Spouse name: N/A  . Number of children: N/A  . Years of education: N/A   Social History Main Topics  . Smoking status: Former Smoker    Quit date: 08/20/1984  . Smokeless tobacco: Never Used  . Alcohol use No  . Drug use: No  . Sexual activity: Not Asked   Other Topics Concern  . None   Social History Narrative  . None   Review of Systems  Constitutional: Negative.   Respiratory: Negative.   Cardiovascular: Negative.    Objective:  BP 104/60 (BP Location: Right Arm, Patient Position: Sitting, Cuff Size: Normal)   Pulse 61   Temp 98.2 F (36.8 C) (Oral)   Wt 116 lb 2 oz (52.7 kg)   SpO2 90%   BMI 21.24 kg/m   BP/Weight 03/13/2016 10/04/2015 04/04/2015  Systolic BP 104 130 121  Diastolic BP 60 72 64  Wt. (Lbs) 116.13 115 117.38  BMI 21.24 21.03 21.46   Physical Exam  Constitutional: She is oriented to person, place, and time. She appears well-developed. No distress.  Cardiovascular: Normal rate and regular rhythm.   Pulmonary/Chest: Effort normal. She has no wheezes.  Abdominal: Soft. She exhibits no distension. There is no tenderness.  Neurological: She is alert and oriented to person, place, and time.  Psychiatric: She has a normal mood and affect.  Vitals reviewed.   Lab Results  Component Value Date   WBC 8.3 11/30/2014   HGB 14.3 11/30/2014   HCT 42.7 11/30/2014   PLT 216.0 11/30/2014   GLUCOSE 88 11/30/2014   CHOL 180  12/21/2013   TRIG 109.0 12/21/2013   HDL 46.40 12/21/2013   LDLCALC 112 (H) 12/21/2013   ALT 13 11/30/2014   AST 19 11/30/2014   NA 138 11/30/2014   K 4.1 11/30/2014   CL 100 11/30/2014   CREATININE 0.59 11/30/2014   BUN 11 11/30/2014   CO2 32 11/30/2014   TSH 1.21 11/30/2014    Assessment & Plan:   Problem List Items Addressed This Visit    Preventative health care    Flu shot today. Declines other immunizations and preventative health care items.      Macular degeneration    Stable.       Lichen sclerosus et atrophicus of the vulva - Primary    Stable. Continue PRN Clobetasol.        Other Visit Diagnoses    Encounter for immunization       Relevant Orders   Flu vaccine HIGH DOSE PF (Completed)     Follow-up: Patient desires to only be seen as needed.   Everlene Other DO [facility]

## 2016-03-13 NOTE — Assessment & Plan Note (Signed)
Flu shot today. Declines other immunizations and preventative health care items.

## 2016-04-05 ENCOUNTER — Encounter: Payer: Medicare Other | Admitting: Internal Medicine

## 2016-04-09 ENCOUNTER — Telehealth: Payer: Self-pay | Admitting: Family Medicine

## 2016-04-09 NOTE — Telephone Encounter (Signed)
Pt declined to get a AWV. Thank you! °

## 2016-06-12 ENCOUNTER — Telehealth: Payer: Self-pay | Admitting: Family Medicine

## 2016-06-12 NOTE — Telephone Encounter (Signed)
Pt declined appt. Pt states she does not need the appt. Thank you!

## 2016-07-15 ENCOUNTER — Telehealth: Payer: Self-pay | Admitting: Family Medicine

## 2016-07-15 NOTE — Telephone Encounter (Signed)
Left pt message asking to call Allison back directly at [phone] to schedule AWV. Thanks! °

## 2016-09-03 NOTE — Telephone Encounter (Signed)
Pt declined AWV. °

## 2016-09-26 ENCOUNTER — Telehealth: Payer: Self-pay | Admitting: Family Medicine

## 2016-09-26 NOTE — Telephone Encounter (Signed)
Pt will call back to schedule AWV °

## 2016-11-04 NOTE — Telephone Encounter (Signed)
Left pt message asking to call Allison back directly at [phone] to schedule AWV. Thanks! °

## 2016-12-16 ENCOUNTER — Encounter: Payer: Self-pay | Admitting: Family Medicine

## 2016-12-16 ENCOUNTER — Ambulatory Visit (INDEPENDENT_AMBULATORY_CARE_PROVIDER_SITE_OTHER): Payer: Medicare Other | Admitting: Family Medicine

## 2016-12-16 VITALS — BP 124/78 | HR 63 | Temp 97.8°F | Resp 16 | Wt 108.5 lb

## 2016-12-16 DIAGNOSIS — R197 Diarrhea, unspecified: Secondary | ICD-10-CM | POA: Insufficient documentation

## 2016-12-16 MED ORDER — ONDANSETRON HCL 4 MG PO TABS: 4.0000 mg | ORAL_TABLET | Freq: Three times a day (TID) | ORAL | 0 refills | Status: DC | PRN

## 2016-12-16 NOTE — Assessment & Plan Note (Signed)
New acute problem. Uncertain etiology. ? Infectious. Obtaining stool studies. CMP today.

## 2016-12-16 NOTE — Patient Instructions (Addendum)
Zofran as need for nausea. It will also aid the diarrhea.  Return the stool sample ASAP.  Take care  Dr. Adriana Simas

## 2016-12-16 NOTE — Progress Notes (Signed)
Subjective:  Patient ID: Debbora Lacrosse, female    DOB: [birth year]  Age: 83 y.o. MRN: [id number]  CC: Diarrhea  HPI:  83 year old female presents with diarrhea.  Patient reports that last Monday she ate dinner out. She ate fish and a baked potato. The following day she developed diarrhea. Her diarrhea has persisted since that time. She states that it improved slightly but then recurs again. She reports that she initially had some subjective fever. She did not take her temperature. No blood in the stool. She reports associated abdominal cramping. No medications tried. She has altered her diet without improvement. She reports some associated nausea but no vomiting. Moderate in severity. No other associated symptoms. No known exacerbating factors. No other complaints or concerns at this time.  Social Hx   Social History   Social History  . Marital status: Widowed    Spouse name: N/A  . Number of children: N/A  . Years of education: N/A   Social History Main Topics  . Smoking status: Former Smoker    Quit date: 08/20/1984  . Smokeless tobacco: Never Used  . Alcohol use No  . Drug use: No  . Sexual activity: Not Asked   Other Topics Concern  . None   Social History Narrative  . None    Review of Systems  Constitutional:       Subjective fever.  Gastrointestinal: Positive for abdominal pain, diarrhea and nausea. Negative for blood in stool.   Objective:  BP 124/78 (BP Location: Left Arm, Patient Position: Sitting, Cuff Size: Normal)   Pulse 63   Temp 97.8 F (36.6 C) (Oral)   Resp 16   Wt 108 lb 8 oz (49.2 kg)   SpO2 95%   BMI 19.84 kg/m   BP/Weight 12/16/2016 03/13/2016 4/88/8916  Systolic BP 945 038 882  Diastolic BP 78 60 72  Wt. (Lbs) 108.5 116.13 115  BMI 19.84 21.24 21.03    Physical Exam  Constitutional: She is oriented to person, place, and time. She appears well-developed. No distress.  Cardiovascular: Normal rate and regular rhythm.   No murmur  heard. Pulmonary/Chest: Effort normal. She has no wheezes. She has no rales.  Abdominal: Soft. She exhibits no distension. There is no tenderness. There is no rebound and no guarding.  Neurological: She is alert and oriented to person, place, and time.  Psychiatric: She has a normal mood and affect.  Vitals reviewed.  Lab Results  Component Value Date   WBC 8.3 11/30/2014   HGB 14.3 11/30/2014   HCT 42.7 11/30/2014   PLT 216.0 11/30/2014   GLUCOSE 88 11/30/2014   CHOL 180 12/21/2013   TRIG 109.0 12/21/2013   HDL 46.40 12/21/2013   LDLCALC 112 (H) 12/21/2013   ALT 13 11/30/2014   AST 19 11/30/2014   NA 138 11/30/2014   K 4.1 11/30/2014   CL 100 11/30/2014   CREATININE 0.59 11/30/2014   BUN 11 11/30/2014   CO2 32 11/30/2014   TSH 1.21 11/30/2014    Assessment & Plan:   Problem List Items Addressed This Visit    Diarrhea - Primary    New acute problem. Uncertain etiology. ? Infectious. Obtaining stool studies. CMP today.      Relevant Orders   Comp Met (CMET)   Gastrointestinal Panel by PCR , Stool      Meds ordered this encounter  Medications  . ondansetron (ZOFRAN) 4 MG tablet    Sig: Take 1 tablet (4 mg total) by  mouth every 8 (eight) hours as needed for nausea or vomiting.    Dispense:  20 tablet    Refill:  0     Follow-up: PRN  [place]

## 2016-12-17 LAB — COMPREHENSIVE METABOLIC PANEL
ALBUMIN: 4.2 g/dL (ref 3.6–5.1)
ALK PHOS: 89 U/L (ref 33–130)
ALT: 10 U/L (ref 6–29)
AST: 18 U/L (ref 10–35)
BUN: 10 mg/dL (ref 7–25)
CO2: 30 mmol/L (ref 20–32)
CREATININE: 0.55 mg/dL — AB (ref 0.60–0.88)
Calcium: 9.6 mg/dL (ref 8.6–10.4)
Chloride: 95 mmol/L — ABNORMAL LOW (ref 98–110)
Glucose, Bld: 93 mg/dL (ref 65–99)
Potassium: 4 mmol/L (ref 3.5–5.3)
SODIUM: 139 mmol/L (ref 135–146)
TOTAL PROTEIN: 7.5 g/dL (ref 6.1–8.1)
Total Bilirubin: 0.7 mg/dL (ref 0.2–1.2)

## 2016-12-20 NOTE — Addendum Note (Signed)
Addended by: Warden Fillers on: 12/20/2016 10:38 AM   Modules accepted: Orders

## 2016-12-23 LAB — GASTROINTESTINAL PATHOGEN PANEL PCR
C. difficile Tox A/B, PCR: NOT DETECTED
CRYPTOSPORIDIUM, PCR: NOT DETECTED
Campylobacter, PCR: NOT DETECTED
E COLI (ETEC) LT/ST, PCR: NOT DETECTED
E COLI (STEC) STX1/STX2, PCR: NOT DETECTED
E COLI 0157, PCR: NOT DETECTED
Giardia lamblia, PCR: NOT DETECTED
Norovirus, PCR: NOT DETECTED
Rotavirus A, PCR: NOT DETECTED
SALMONELLA, PCR: NOT DETECTED
Shigella, PCR: NOT DETECTED

## 2017-05-02 ENCOUNTER — Other Ambulatory Visit: Payer: Self-pay

## 2017-05-02 ENCOUNTER — Emergency Department: Payer: Medicare Other

## 2017-05-02 ENCOUNTER — Emergency Department
Admission: EM | Admit: 2017-05-02 | Discharge: 2017-05-02 | Disposition: A | Discharge status: Home / Self Care | Payer: Medicare Other | Attending: Emergency Medicine | Admitting: Emergency Medicine

## 2017-05-02 DIAGNOSIS — Z87891 Personal history of nicotine dependence: Secondary | ICD-10-CM | POA: Diagnosis not present

## 2017-05-02 DIAGNOSIS — Z79899 Other long term (current) drug therapy: Secondary | ICD-10-CM | POA: Diagnosis not present

## 2017-05-02 DIAGNOSIS — R531 Weakness: Secondary | ICD-10-CM | POA: Insufficient documentation

## 2017-05-02 DIAGNOSIS — R911 Solitary pulmonary nodule: Secondary | ICD-10-CM | POA: Diagnosis not present

## 2017-05-02 LAB — BASIC METABOLIC PANEL
ANION GAP: 9 (ref 5–15)
BUN: 15 mg/dL (ref 6–20)
CHLORIDE: 100 mmol/L — AB (ref 101–111)
CO2: 28 mmol/L (ref 22–32)
Calcium: 9.6 mg/dL (ref 8.9–10.3)
Creatinine, Ser: 0.57 mg/dL (ref 0.44–1.00)
GFR calc non Af Amer: >60 mL/min (ref >60)
Glucose, Bld: 100 mg/dL — ABNORMAL HIGH (ref 65–99)
Potassium: 3.8 mmol/L (ref 3.5–5.1)
SODIUM: 137 mmol/L (ref 135–145)

## 2017-05-02 LAB — URINALYSIS, COMPLETE (UACMP) WITH MICROSCOPIC
BACTERIA UA: NONE SEEN
BILIRUBIN URINE: NEGATIVE
Glucose, UA: NEGATIVE mg/dL
HGB URINE DIPSTICK: NEGATIVE
KETONES UR: 5 mg/dL — AB
LEUKOCYTES UA: NEGATIVE
NITRITE: NEGATIVE
PH: 7 (ref 5.0–8.0)
Protein, ur: NEGATIVE mg/dL
SPECIFIC GRAVITY, URINE: 1.011 (ref 1.005–1.030)
SQUAMOUS EPITHELIAL / LPF: NONE SEEN

## 2017-05-02 LAB — CBC
HCT: 43.1 % (ref 35.0–47.0)
Hemoglobin: 14.3 g/dL (ref 12.0–16.0)
MCH: 30.3 pg (ref 26.0–34.0)
MCHC: 33.2 g/dL (ref 32.0–36.0)
MCV: 91.1 fL (ref 80.0–100.0)
Platelets: 204 10*3/uL (ref 150–440)
RBC: 4.73 MIL/uL (ref 3.80–5.20)
RDW: 14.1 % (ref 11.5–14.5)
WBC: 7.5 10*3/uL (ref 3.6–11.0)

## 2017-05-02 LAB — TROPONIN I

## 2017-05-02 NOTE — ED Notes (Signed)
Patient transported to X-ray 

## 2017-05-02 NOTE — ED Notes (Signed)
Placed urinary HAT in commode for patient in order to collect urine sample. Patient missed the HAT.

## 2017-05-02 NOTE — ED Triage Notes (Signed)
Pt to ER via [facility] from home. Pt lives in the [place] of [place]. Pt had onset of generalized weakness this AM while standing up. No fall. No LOC. Pt alert and oriented X4, active, cooperative, pt in NAD. RR even and unlabored, color WNL.  States chest "fullness".

## 2017-05-02 NOTE — ED Notes (Signed)
Provider notified that patient missed the HAT.

## 2017-05-02 NOTE — ED Notes (Signed)
E sig not working.  Pt verbalized understanding.  Pt in nad.  Pt d/c with family.

## 2017-05-02 NOTE — Discharge Instructions (Addendum)
As we discussed please follow up with Please seek medical attention for any high fevers, chest pain, shortness of breath, change in behavior, persistent vomiting, bloody stool or any other new or concerning symptoms.

## 2017-05-02 NOTE — ED Notes (Signed)
Notified ED Provider patient hit HAT and urine sample sent to lab

## 2017-05-02 NOTE — ED Provider Notes (Signed)
[facility] Emergency Department Provider Note   ____________________________________________   I have reviewed the triage vital signs and the nursing notes.   HISTORY  Chief Complaint Weakness   History limited by: Not Limited   HPI Debra Hubbard is a 83 y.o. female who presents to the emergency department today because of an episode of weakness.  LOCATION:generalized DURATION:occured today TIMING: sudden SEVERITY: patient felt like she would collapse QUALITY: weakness CONTEXT: patient states she was bending over a file cabinet when she suddenly felt weak. She was able to get herself to a couch before falling down. No LOC.  MODIFYING FACTORS: none ASSOCIATED SYMPTOMS: denies any chest pain. Denies any recent n/v. Denies any fevers.  Per medical record review patient has a history of gastritis.  Past Medical History:  Diagnosis Date  . Cataracts, bilateral   . Degenerative joint disease   . Gastritis    followed by Dr. Bluford Kaufmann  . Macular degeneration   . Squamous cell carcinoma    face, s/p excision    Patient Active Problem List   Diagnosis Date Noted  . Diarrhea 12/16/2016  . Preventative health care 03/13/2016  . Hearing loss 01/02/2015  . Lichen sclerosus et atrophicus of the vulva 11/30/2014  . Medicare annual wellness visit, subsequent 06/02/2012  . Allergic rhinitis 02/27/2012  . Macular degeneration 05/18/2011    Past Surgical History:  Procedure Laterality Date  . APPENDECTOMY  1957  . BREAST BIOPSY     x 3, all normal  . EXPLORATORY LAPAROTOMY  1963  . OOPHORECTOMY  1957    Prior to Admission medications   Medication Sig Start Date End Date Taking? Authorizing Provider  clobetasol ointment (TEMOVATE) 0.05 % Apply 1 application topically 2 (two) times daily. 04/04/15   Shelia Media, MD  ipratropium (ATROVENT) 0.03 % nasal spray Place 2 sprays into both nostrils every 12 (twelve) hours. 10/04/15   Shelia Media,  MD  Multiple Vitamin (MULTIVITAMIN) tablet Take 1 tablet by mouth daily.      [provider]  Multiple Vitamins-Minerals (VISION-VITE PRESERVE PO) Take one by mouth twice a day.     [provider]  ondansetron (ZOFRAN) 4 MG tablet Take 1 tablet (4 mg total) by mouth every 8 (eight) hours as needed for nausea or vomiting. 12/16/16   Tommie Sams, DO    Allergies Penicillins  Family History  Problem Relation Age of Onset  . Cancer Neg Hx   . Heart disease Neg Hx     Social History Social History   Tobacco Use  . Smoking status: Former Smoker    Last attempt to quit: 08/20/1984    Years since quitting: 32.7  . Smokeless tobacco: Never Used  Substance Use Topics  . Alcohol use: No  . Drug use: No    Review of Systems Constitutional: No fever/chills. Positive for generalized weakness. Eyes: No visual changes. ENT: No sore throat. Cardiovascular: Denies chest pain. Respiratory: Denies shortness of breath. Gastrointestinal: No abdominal pain.  No nausea, no vomiting.  No diarrhea.   Genitourinary: Negative for dysuria. Musculoskeletal: Negative for back pain. Skin: Negative for rash. Neurological: Negative for headaches, focal weakness or numbness.  ____________________________________________   PHYSICAL EXAM:  VITAL SIGNS: ED Triage Vitals  Enc Vitals Group     BP 05/02/17 1212 (!) 145/86     Pulse Rate 05/02/17 1212 70     Resp 05/02/17 1212 (!) 22     Temp 05/02/17 1212 98.2  F (36.8 C)     Temp Source 05/02/17 1212 Oral     SpO2 05/02/17 1212 96 %     Weight 05/02/17 1212 105 lb (47.6 kg)     Height 05/02/17 1212 5\' 3"  (1.6 m)     Head Circumference --      Peak Flow --      Pain Score 05/02/17 1211 3    Constitutional: Alert and oriented. Well appearing and in no distress. Eyes: Conjunctivae are normal.  ENT   Head: Normocephalic and atraumatic.   Nose: No congestion/rhinnorhea.   Mouth/Throat: Mucous membranes are moist.    Neck: No stridor. Hematological/Lymphatic/Immunilogical: No cervical lymphadenopathy. Cardiovascular: Normal rate, regular rhythm.  No murmurs, rubs, or gallops.  Respiratory: Normal respiratory effort without tachypnea nor retractions. Breath sounds are clear and equal bilaterally. No wheezes/rales/rhonchi. Gastrointestinal: Soft and non tender. No rebound. No guarding.  Genitourinary: Deferred Musculoskeletal: Normal range of motion in all extremities. No lower extremity edema. Neurologic:  Normal speech and language. No gross focal neurologic deficits are appreciated.  Skin:  Skin is warm, dry and intact. No rash noted. Psychiatric: Mood and affect are normal. Speech and behavior are normal. Patient exhibits appropriate insight and judgment.  ____________________________________________    LABS (pertinent positives/negatives)  UA not consistent with infection CBC wnl BMP cl 100, glu 100 otherwise wnl Trop <0.03  ____________________________________________   EKG  I, 05/04/17, attending physician, personally viewed and interpreted this EKG  EKG Time: 1210 Rate: 70 Rhythm: normal sinus rhythm Axis: normal Intervals: qtc 426 QRS: q waves V1 ST changes: no st elevation Impression: abnormal ekg  ____________________________________________    RADIOLOGY  CXR Question pulmonary nodule  ____________________________________________   PROCEDURES  Procedures  ____________________________________________   INITIAL IMPRESSION / ASSESSMENT AND PLAN / ED COURSE  Pertinent labs & imaging results that were available during my care of the patient were reviewed by me and considered in my medical decision making (see chart for details).  Patient presented to the emergency department today after an episode of weakness. Patient appears well on physical exam. ddx would include arrythmia, anemia, infection amongst other etiologies. Work up without any acute concerning  findings. Patient without any further episodes in the emergency department. CXR did show possible nodule. Discussed with patient need for follow up CT and to discuss with her PCP.   ____________________________________________   FINAL CLINICAL IMPRESSION(S) / ED DIAGNOSES  Final diagnoses:  Weakness  Pulmonary nodule     Note: This dictation was prepared with Dragon dictation. Any transcriptional errors that result from this process are unintentional     Phineas Semen, MD 05/02/17 [phone]

## 2017-05-16 ENCOUNTER — Encounter: Payer: Self-pay | Admitting: Internal Medicine

## 2017-05-16 ENCOUNTER — Ambulatory Visit (INDEPENDENT_AMBULATORY_CARE_PROVIDER_SITE_OTHER): Payer: Medicare Other | Admitting: Internal Medicine

## 2017-05-16 VITALS — BP 112/68 | HR 66 | Temp 97.5°F | Resp 15 | Ht 63.0 in | Wt 109.6 lb

## 2017-05-16 DIAGNOSIS — R55 Syncope and collapse: Secondary | ICD-10-CM | POA: Diagnosis not present

## 2017-05-16 DIAGNOSIS — R911 Solitary pulmonary nodule: Secondary | ICD-10-CM | POA: Diagnosis not present

## 2017-05-16 NOTE — Progress Notes (Signed)
Subjective:  Patient ID: Debra Hubbard, female    DOB: 03-25-1927  Age: 84 y.o. MRN: [id number]  CC: The primary encounter diagnosis was Nodule of upper lobe of left lung. Diagnoses of Lung nodule and Near syncope were also pertinent to this visit.  HPI Debra Hubbard presents for ER follow up on presyncopal episode that occurred on Dec 21.  Patient states that she was doing some filing in her home,  Not bending over.  Suddenly felt that "everything stopped" and that she was going to "die."  She felt weak all over but  was able to maneuver to the couch  And avoid falling to the ground .  She denies headache, chest pain and shortness of breath.     ER workup reviewed with patient and daughter.  Chest x ray noted a LUL nodule and hyperexpanded lungs.  Patient has a remote  history of tobacco abuse,  < 20 years total,  Quit over 30 years.ago, and has had no cough, unintentional weight loss , or  dyspnea     She performs all activities independently,  But no longer drives due to vision changes ,  And admits that she is a "couch potato"      Outpatient Medications Prior to Visit  Medication Sig Dispense Refill  . acetaminophen (TYLENOL) 500 MG tablet Take 500 mg by mouth every 6 (six) hours as needed.    . carboxymethylcellul-glycerin (OPTIVE) 0.5-0.9 % ophthalmic solution Place 1 drop into both eyes daily as needed for dry eyes.    . Multiple Vitamin (MULTIVITAMIN) tablet Take 1 tablet by mouth daily.      . Multiple Vitamins-Minerals (VISION-VITE PRESERVE PO) Take one by mouth twice a day.     . clobetasol ointment (TEMOVATE) 0.05 % Apply 1 application topically 2 (two) times daily. (Patient not taking: Reported on 05/02/2017) 30 g 2  . ipratropium (ATROVENT) 0.03 % nasal spray Place 2 sprays into both nostrils every 12 (twelve) hours. (Patient not taking: Reported on 05/02/2017) 30 mL 12  . ondansetron (ZOFRAN) 4 MG tablet Take 1 tablet (4 mg total) by mouth every 8 (eight) hours as needed  for nausea or vomiting. (Patient not taking: Reported on 05/02/2017) 20 tablet 0   No facility-administered medications prior to visit.     Review of Systems;  Patient denies headache, fevers, malaise, unintentional weight loss, skin rash, eye pain, sinus congestion and sinus pain, sore throat, dysphagia,  hemoptysis , cough, dyspnea, wheezing, chest pain, palpitations, orthopnea, edema, abdominal pain, nausea, melena, diarrhea, constipation, flank pain, dysuria, hematuria, urinary  Frequency, nocturia, numbness, tingling, seizures,  Focal weakness, Loss of consciousness,  Tremor, insomnia, depression, anxiety, and suicidal ideation.      Objective:  BP 112/68 (BP Location: Left Arm, Patient Position: Sitting, Cuff Size: Normal)   Pulse 66   Temp (!) 97.5 F (36.4 C) (Oral)   Resp 15   Ht 5\' 3"  (1.6 m)   Wt 109 lb 9.6 oz (49.7 kg)   SpO2 93%   BMI 19.41 kg/m   BP Readings from Last 3 Encounters:  05/16/17 112/68  05/02/17 115/73  12/16/16 124/78    Wt Readings from Last 3 Encounters:  05/16/17 109 lb 9.6 oz (49.7 kg)  05/02/17 105 lb (47.6 kg)  12/16/16 108 lb 8 oz (49.2 kg)    General appearance: alert, cooperative and appears stated age Ears: normal TM's and external ear canals both ears Throat: lips, mucosa, and tongue normal; teeth and  gums normal Neck: no adenopathy, no carotid bruit, supple, symmetrical, trachea midline and thyroid not enlarged, symmetric, no tenderness/mass/nodules Back: symmetric, no curvature. ROM normal. No CVA tenderness. Lungs: clear to auscultation bilaterally Heart: regular rate and rhythm, S1, S2 normal, no murmur, click, rub or gallop Abdomen: soft, non-tender; bowel sounds normal; no masses,  no organomegaly Pulses: 2+ and symmetric Skin: Skin color, texture, turgor normal. No rashes or lesions Lymph nodes: Cervical, supraclavicular, and axillary nodes normal.  No results found for: HGBA1C  Lab Results  Component Value Date    CREATININE 0.57 05/02/2017   CREATININE 0.55 (L) 12/16/2016   CREATININE 0.59 11/30/2014    Lab Results  Component Value Date   WBC 7.5 05/02/2017   HGB 14.3 05/02/2017   HCT 43.1 05/02/2017   PLT 204 05/02/2017   GLUCOSE 100 (H) 05/02/2017   CHOL 180 12/21/2013   TRIG 109.0 12/21/2013   HDL 46.40 12/21/2013   LDLCALC 112 (H) 12/21/2013   ALT 10 12/16/2016   AST 18 12/16/2016   NA 137 05/02/2017   K 3.8 05/02/2017   CL 100 (L) 05/02/2017   CREATININE 0.57 05/02/2017   BUN 15 05/02/2017   CO2 28 05/02/2017   TSH 1.21 11/30/2014    Dg Chest 2 View  Result Date: 05/02/2017 CLINICAL DATA:  Chest tightness starting this morning. Former smoker. EXAM: CHEST  2 VIEW COMPARISON:  None. FINDINGS: Heart size and mediastinal contours are within normal limits. There is nodular density within the left upper lobe, suspicious for pulmonary nodule. No confluent opacity to suggest a developing pneumonia. No pleural effusion or pneumothorax seen. Lungs are hyperexpanded suggesting COPD. Mild degenerative spurring within the lower thoracic spine. No acute or suspicious osseous finding. IMPRESSION: 1. Nodular density projected over the left upper lung, suspicious for pulmonary nodule. Recommend chest CT for further characterization. 2. Hyperexpanded lungs suggesting COPD. 3. No evidence of pneumonia or pulmonary edema. Electronically Signed   By: Bary Richard M.D.   On: 05/02/2017 12:34    Assessment & Plan:   Problem List Items Addressed This Visit    Lung nodule    Left upper lobe,  Incidental finding during ER workup for near syncope .  She is an ex smoker.   CT  Chest advised and ordered,.         Near syncope    Etiology unclear.  She ruled out in ER for AMI. UTI, anemia, etc.  Has not recurred .  Discussed need for further workup for arrhythmia if it occurs again.  She does not drive  Lab Results  Component Value Date   WBC 7.5 05/02/2017   HGB 14.3 05/02/2017   HCT 43.1 05/02/2017     MCV 91.1 05/02/2017   PLT 204 05/02/2017   Lab Results  Component Value Date   CREATININE 0.57 05/02/2017          Other Visit Diagnoses    Nodule of upper lobe of left lung    -  Primary   Relevant Orders   CT Chest Wo Contrast     A total of 25 minutes was spent with patient more than half of which was spent in counseling patient on the above mentioned issues , reviewing and explaining recent labs and imaging studies done, and coordination of care.    I have discontinued Cathie Hoops. Nusser's ipratropium and ondansetron. I am also having her maintain her Multiple Vitamins-Minerals (VISION-VITE PRESERVE PO), multivitamin, clobetasol ointment, carboxymethylcellul-glycerin, and acetaminophen.  No  orders of the defined types were placed in this encounter.   Medications Discontinued During This Encounter  Medication Reason  . ipratropium (ATROVENT) 0.03 % nasal spray Patient has not taken in last 30 days  . ondansetron (ZOFRAN) 4 MG tablet Patient has not taken in last 30 days    Follow-up: Return in about 6 months (around 11/13/2017).   Sherlene Shams, MD

## 2017-05-16 NOTE — Patient Instructions (Signed)
I have ordered the CT of your chest to check out the shadow on your left lung  If you need something for your nerves,   You let me know

## 2017-05-18 DIAGNOSIS — R55 Syncope and collapse: Secondary | ICD-10-CM | POA: Insufficient documentation

## 2017-05-18 NOTE — Assessment & Plan Note (Signed)
Left upper lobe,  Incidental finding during ER workup for near syncope .  She is an ex smoker.   CT  Chest advised and ordered,.

## 2017-05-18 NOTE — Assessment & Plan Note (Signed)
Etiology unclear.  She ruled out in ER for AMI. UTI, anemia, etc.  Has not recurred .  Discussed need for further workup for arrhythmia if it occurs again.  She does not drive  Lab Results  Component Value Date   WBC 7.5 05/02/2017   HGB 14.3 05/02/2017   HCT 43.1 05/02/2017   MCV 91.1 05/02/2017   PLT 204 05/02/2017   Lab Results  Component Value Date   CREATININE 0.57 05/02/2017

## 2017-05-27 ENCOUNTER — Ambulatory Visit
Admission: RE | Admit: 2017-05-27 | Discharge: 2017-05-27 | Disposition: A | Discharge status: Home / Self Care | Payer: Medicare Other | Source: Ambulatory Visit | Attending: Internal Medicine | Admitting: Internal Medicine

## 2017-05-27 DIAGNOSIS — R911 Solitary pulmonary nodule: Secondary | ICD-10-CM

## 2017-05-27 DIAGNOSIS — I700 Atherosclerosis of aorta: Secondary | ICD-10-CM | POA: Diagnosis not present

## 2017-05-27 DIAGNOSIS — R918 Other nonspecific abnormal finding of lung field: Secondary | ICD-10-CM | POA: Insufficient documentation

## 2017-05-28 ENCOUNTER — Encounter: Payer: Self-pay | Admitting: Internal Medicine

## 2017-05-28 DIAGNOSIS — I2510 Atherosclerotic heart disease of native coronary artery without angina pectoris: Secondary | ICD-10-CM | POA: Insufficient documentation

## 2017-06-03 ENCOUNTER — Other Ambulatory Visit: Payer: Self-pay | Admitting: Internal Medicine

## 2017-06-03 ENCOUNTER — Telehealth: Payer: Self-pay

## 2017-06-03 DIAGNOSIS — R55 Syncope and collapse: Secondary | ICD-10-CM

## 2017-06-03 DIAGNOSIS — I2510 Atherosclerotic heart disease of native coronary artery without angina pectoris: Secondary | ICD-10-CM

## 2017-06-03 NOTE — Telephone Encounter (Signed)
Copied from [id number]. Topic: Quick Communication - Other Results >> Jun 03, 2017 10:37 AM Herby Abraham C wrote: Pt called in for her imaging results.    Please advise.

## 2017-06-03 NOTE — Assessment & Plan Note (Signed)
Referral to cardiology given findings on chest CT suggesting 2 vessel CAD

## 2017-06-03 NOTE — Telephone Encounter (Signed)
See result note message 

## 2017-06-04 ENCOUNTER — Ambulatory Visit: Payer: Medicare Other | Admitting: Internal Medicine

## 2017-07-06 NOTE — Progress Notes (Signed)
Cardiology Office Note  Date:  07/08/2017   ID:  Debra Hubbard, DOB May 02, 1927, MRN [id number]  PCP:  Sherlene Shams, MD   Chief Complaint  Patient presents with  . other    Referred by [facility] Aortic Atherosclerosis found by Chest CT. Meds reviewed verbally with patient.     HPI:   Ms. Lelani Garnett is a  84 year old woman with past medical history of  Smoker, quit 08/1984 Chest pain 25 yr ago Low blood pressure Aortic atherosclerosis and coronary calcification on CT scan January 2019  Referred by Dr. Darrick Huntsman for CAD/aortic atherosclerosis and near syncope/weakness  Reports that she was feeling well in her usual state of health 05/02/17 she acutely developed 10 min of near syncope, weakness Suddenly felt that "everything stopped" and that she was going to "die."  able to maneuver to the couch  And avoid falling to the ground .  She went to the hospital Hospital records reviewed with the patient in detail Workup was essentially negative They did have a CT scan of the chest completed showing aortic atherosclerosis, coronary calcification  Seen by Dr. Darrick Huntsman 05/16/2017  No further episodes since that time, no prior episodes Reports having good activity tolerance No chest pain or shortness of breath on exertion  Chronic leg cramps at night  EKG personally reviewed by myself on todays visit Shows normal sinus rhythm with rate 62 bpm nonspecific ST abnormality   PMH:   has a past medical history of Cataracts, bilateral, Degenerative joint disease, Gastritis, Macular degeneration, and Squamous cell carcinoma.  PSH:    Past Surgical History:  Procedure Laterality Date  . APPENDECTOMY  1957  . BREAST BIOPSY     x 3, all normal  . EXPLORATORY LAPAROTOMY  1963  . OOPHORECTOMY  1957    Current Outpatient Medications  Medication Sig Dispense Refill  . acetaminophen (TYLENOL) 500 MG tablet Take 500 mg by mouth every 6 (six) hours as needed.    . carboxymethylcellul-glycerin  (OPTIVE) 0.5-0.9 % ophthalmic solution Place 1 drop into both eyes daily as needed for dry eyes.    . Multiple Vitamin (MULTIVITAMIN) tablet Take 1 tablet by mouth daily.      . Multiple Vitamins-Minerals (VISION-VITE PRESERVE PO) Take one by mouth twice a day.      No current facility-administered medications for this visit.      Allergies:   Penicillins   Social History:  The patient  reports that she quit smoking about 82 years ago. she has never used smokeless tobacco. She reports that she does not drink alcohol or use drugs.   Family History:   family history is not on file.    Review of Systems: Review of Systems  Respiratory: Negative.   Cardiovascular: Negative.   Gastrointestinal: Negative.   Musculoskeletal: Negative.   Neurological: Positive for weakness.  Psychiatric/Behavioral: Negative.   All other systems reviewed and are negative.    PHYSICAL EXAM: VS:  BP 120/60 (BP Location: Left Arm, Patient Position: Sitting, Cuff Size: Normal)   Pulse 62   Ht 5\' 3"  (1.6 m)   Wt 111 lb (50.3 kg)   BMI 19.66 kg/m  , BMI Body mass index is 19.66 kg/m. GEN: Well nourished, well developed, in no acute distress  HEENT: normal  Neck: no JVD, or masses carotid bruit, 1+ on the right supraclavicular Cardiac: RRR; no murmurs, rubs, or gallops,no edema  Respiratory:  clear to auscultation bilaterally, normal work of breathing GI: soft, nontender, nondistended, +  BS MS: no deformity or atrophy  Skin: warm and dry, no rash Neuro:  Strength and sensation are intact Psych: euthymic mood, full affect    Recent Labs: 12/16/2016: ALT 10 05/02/2017: BUN 15; Creatinine, Ser 0.57; Hemoglobin 14.3; Platelets 204; Potassium 3.8; Sodium 137    Lipid Panel Lab Results  Component Value Date   CHOL 180 12/21/2013   HDL 46.40 12/21/2013   LDLCALC 112 (H) 12/21/2013   TRIG 109.0 12/21/2013      Wt Readings from Last 3 Encounters:  07/08/17 111 lb (50.3 kg)  05/16/17 109 lb 9.6  oz (49.7 kg)  05/02/17 105 lb (47.6 kg)       ASSESSMENT AND PLAN:  Near syncope /weakness Etiology unclear Acute onset lasting 10 minutes Unable to exclude arrhythmia Recommended event monitor for further evaluation She does report blood pressure runs low but denies dizziness at the time No prior episodes, none since that time  Less likely ischemia given her good activity level  Atherosclerosis of native coronary artery of native heart without angina pectoris - Plan: EKG 12-Lead There is coronary calcification noted in her proximal LAD She denies any symptoms concerning for angina Prior smoking history We did discuss the cholesterol medication, she did not feel inclined to start medication at this time  Aortic atherosclerosis Moderate diffuse atherosclerosis predominantly in the aortic arch Images pulled up in the office and discussed with her She does have prior  smoking history She does not want cholesterol medication at this time  Lung nodule Seen on CT scan Will defer to primary care  Disposition:   F/U as needed We will call her with the results of her event monitor   Total encounter time more than 45 minutes  Greater than 50% was spent in counseling and coordination of care with the patient    Orders Placed This Encounter  Procedures  . EKG 12-Lead     Signed, Dossie Arbour, M.D., Ph.D. 07/08/2017  [facility] [place], [place] [phone]

## 2017-07-07 ENCOUNTER — Ambulatory Visit: Payer: Medicare Other | Admitting: Cardiovascular Disease

## 2017-07-08 ENCOUNTER — Ambulatory Visit (INDEPENDENT_AMBULATORY_CARE_PROVIDER_SITE_OTHER): Payer: Medicare Other | Admitting: Cardiovascular Disease

## 2017-07-08 ENCOUNTER — Ambulatory Visit (INDEPENDENT_AMBULATORY_CARE_PROVIDER_SITE_OTHER): Payer: Medicare Other

## 2017-07-08 ENCOUNTER — Encounter: Payer: Self-pay | Admitting: Cardiovascular Disease

## 2017-07-08 VITALS — BP 120/60 | HR 62 | Ht 63.0 in | Wt 111.0 lb

## 2017-07-08 DIAGNOSIS — R55 Syncope and collapse: Secondary | ICD-10-CM

## 2017-07-08 DIAGNOSIS — I2510 Atherosclerotic heart disease of native coronary artery without angina pectoris: Secondary | ICD-10-CM | POA: Diagnosis not present

## 2017-07-08 DIAGNOSIS — R911 Solitary pulmonary nodule: Secondary | ICD-10-CM | POA: Diagnosis not present

## 2017-07-08 DIAGNOSIS — R197 Diarrhea, unspecified: Secondary | ICD-10-CM | POA: Diagnosis not present

## 2017-07-08 NOTE — Patient Instructions (Addendum)
Medication Instructions:   Consider starting aspirin 81 mg daily  Generic of zyrtec (cetirazine) 1/2 up to one whole a day Try the flonase nasal spray  Please talk with Dr. Darrick Huntsman about cholesterol medication   Labwork:  No new labs needed  Testing/Procedures:  We will order a zio monitor for weakness, near syncope Zio # [id number]  Follow-Up: It was a pleasure seeing you in the office today. Please call us if you have new issues that need to be addressed before your next appt.  [phone]  Your physician wants you to follow-up in:  As needed  If you need a refill on your cardiac medications before your next appointment, please call your pharmacy.  For educational health videos Log in to : [email or web address] Or : [email or web address], password : [email or web address]

## 2017-07-09 ENCOUNTER — Emergency Department: Payer: Medicare Other

## 2017-07-09 ENCOUNTER — Encounter: Payer: Self-pay | Admitting: Internal Medicine

## 2017-07-09 ENCOUNTER — Other Ambulatory Visit: Payer: Self-pay

## 2017-07-09 ENCOUNTER — Ambulatory Visit (INDEPENDENT_AMBULATORY_CARE_PROVIDER_SITE_OTHER): Payer: Medicare Other | Admitting: Internal Medicine

## 2017-07-09 ENCOUNTER — Emergency Department
Admission: EM | Admit: 2017-07-09 | Discharge: 2017-07-09 | Disposition: A | Discharge status: Home / Self Care | Payer: Medicare Other | Attending: Emergency Medicine | Admitting: Emergency Medicine

## 2017-07-09 VITALS — BP 144/62 | HR 73 | Temp 97.6°F | Resp 15 | Wt 114.6 lb

## 2017-07-09 DIAGNOSIS — R42 Dizziness and giddiness: Secondary | ICD-10-CM | POA: Diagnosis not present

## 2017-07-09 DIAGNOSIS — I2510 Atherosclerotic heart disease of native coronary artery without angina pectoris: Secondary | ICD-10-CM | POA: Diagnosis not present

## 2017-07-09 DIAGNOSIS — R002 Palpitations: Secondary | ICD-10-CM | POA: Diagnosis not present

## 2017-07-09 DIAGNOSIS — Z87891 Personal history of nicotine dependence: Secondary | ICD-10-CM | POA: Insufficient documentation

## 2017-07-09 DIAGNOSIS — Z85828 Personal history of other malignant neoplasm of skin: Secondary | ICD-10-CM | POA: Diagnosis not present

## 2017-07-09 DIAGNOSIS — R29898 Other symptoms and signs involving the musculoskeletal system: Secondary | ICD-10-CM | POA: Diagnosis not present

## 2017-07-09 DIAGNOSIS — M6281 Muscle weakness (generalized): Secondary | ICD-10-CM | POA: Diagnosis not present

## 2017-07-09 DIAGNOSIS — Z8673 Personal history of transient ischemic attack (TIA), and cerebral infarction without residual deficits: Secondary | ICD-10-CM | POA: Insufficient documentation

## 2017-07-09 DIAGNOSIS — R0789 Other chest pain: Secondary | ICD-10-CM | POA: Insufficient documentation

## 2017-07-09 LAB — CBC
HCT: 42.6 % (ref 35.0–47.0)
HEMOGLOBIN: 14.2 g/dL (ref 12.0–16.0)
MCH: 30.2 pg (ref 26.0–34.0)
MCHC: 33.4 g/dL (ref 32.0–36.0)
MCV: 90.6 fL (ref 80.0–100.0)
PLATELETS: 207 10*3/uL (ref 150–440)
RBC: 4.71 MIL/uL (ref 3.80–5.20)
RDW: 13.5 % (ref 11.5–14.5)
WBC: 7.2 10*3/uL (ref 3.6–11.0)

## 2017-07-09 LAB — BASIC METABOLIC PANEL
ANION GAP: 9 (ref 5–15)
BUN: 14 mg/dL (ref 6–20)
CALCIUM: 9.2 mg/dL (ref 8.9–10.3)
CO2: 28 mmol/L (ref 22–32)
CREATININE: 0.48 mg/dL (ref 0.44–1.00)
Chloride: 101 mmol/L (ref 101–111)
GFR calc Af Amer: >60 mL/min (ref >60)
GLUCOSE: 100 mg/dL — AB (ref 65–99)
Potassium: 3.7 mmol/L (ref 3.5–5.1)
Sodium: 138 mmol/L (ref 135–145)

## 2017-07-09 LAB — TROPONIN I

## 2017-07-09 NOTE — ED Notes (Signed)

## 2017-07-09 NOTE — Assessment & Plan Note (Signed)
Pt with episode of left arm weakness described as "my left arm felt useless".  Unable to open her hand.  Unable to lift her arm.  This resolved by the time I examined her.  Nurse reported a brief episode of confusion.  Was given 325mg  aspirin as outlined.  Was not on aspirin prior to today.  Discussed the need for further evaluation and concern regarding possible TIA.  She was in agreement for transfer to ER.  To ER for evaluation - via ambulance.

## 2017-07-09 NOTE — ED Provider Notes (Signed)
[facility] Emergency Department Provider Note ____________________________________________   First MD Initiated Contact with Patient 07/09/17 1758     (approximate)  I have reviewed the triage vital signs and the nursing notes.   HISTORY  Chief Complaint Chest Pain    HPI Debra Hubbard is a 84 y.o. female with past medical history as noted below who presents with chest discomfort, acute onset approximately 5 hours ago, described as pressure-like, mainly substernal and nonradiating, and resolving on its own after approximately 90 minutes.  Patient reports feeling mild lightheadedness at that time but no nausea or vomiting, and no difficulty breathing.  The patient also reports over the same.  She began to have symptoms in her left arm, stating that she felt like it was useless, and seemed numb and as if it was not moving the way that she intended to when she tried to move it.  She denies any speech disturbance, severe headache, vision changes, or symptoms in the leg.  No prior history of this.  The patient was seen by her primary care doctor, and sent to the emergency department to evaluate for possible TIA.  She was given aspirin today.   Past Medical History:  Diagnosis Date  . Cataracts, bilateral   . Degenerative joint disease   . Gastritis    followed by Dr. Bluford Kaufmann  . Macular degeneration   . Squamous cell carcinoma    face, s/p excision    Patient Active Problem List   Diagnosis Date Noted  . Left arm weakness 07/09/2017  . Coronary atherosclerosis 05/28/2017  . Near syncope 05/18/2017  . Lung nodule 05/16/2017  . Diarrhea 12/16/2016  . Preventative health care 03/13/2016  . Hearing loss 01/02/2015  . Lichen sclerosus et atrophicus of the vulva 11/30/2014  . Medicare annual wellness visit, subsequent 06/02/2012  . Allergic rhinitis 02/27/2012  . Macular degeneration 05/18/2011    Past Surgical History:  Procedure Laterality Date  .  APPENDECTOMY  1957  . BREAST BIOPSY     x 3, all normal  . EXPLORATORY LAPAROTOMY  1963  . OOPHORECTOMY  1957    Prior to Admission medications   Medication Sig Start Date End Date Taking? Authorizing Provider  acetaminophen (TYLENOL) 500 MG tablet Take 500 mg by mouth every 6 (six) hours as needed.    [provider]  carboxymethylcellul-glycerin (OPTIVE) 0.5-0.9 % ophthalmic solution Place 1 drop into both eyes daily as needed for dry eyes.    [provider]  Multiple Vitamin (MULTIVITAMIN) tablet Take 1 tablet by mouth daily.      [provider]  Multiple Vitamins-Minerals (VISION-VITE PRESERVE PO) Take one by mouth twice a day.     [provider]    Allergies Penicillins  Family History  Problem Relation Age of Onset  . Cancer Neg Hx   . Heart disease Neg Hx     Social History Social History   Tobacco Use  . Smoking status: Former Smoker    Last attempt to quit: 08/20/1984    Years since quitting: 32.9  . Smokeless tobacco: Never Used  Substance Use Topics  . Alcohol use: No  . Drug use: No    Review of Systems  Constitutional: No fever. Eyes: No redness. ENT: No sore throat. Cardiovascular: Positive for resolved chest pain. Respiratory: Denies shortness of breath. Gastrointestinal: No nausea, no vomiting.  No diarrhea.  Genitourinary: Negative for dysuria.  Musculoskeletal: Negative for back pain. Skin: Negative for rash.  Neurological: Negative for headache.   ____________________________________________   PHYSICAL EXAM:  VITAL SIGNS: ED Triage Vitals  Enc Vitals Group     BP 07/09/17 1550 (!) 142/67     Pulse Rate 07/09/17 1550 71     Resp 07/09/17 1550 16     Temp 07/09/17 1550 (!) 97.4 F (36.3 C)     Temp Source 07/09/17 1550 Oral     SpO2 07/09/17 1550 99 %     Weight 07/09/17 1553 111 lb (50.3 kg)     Height 07/09/17 1553 5\' 3"  (1.6 m)     Head Circumference --      Peak Flow --      Pain Score  07/09/17 1550 6     Pain Loc --      Pain Edu? --      Excl. in GC? --     Constitutional: Alert and oriented.  Extremely well appearing for age and in no acute distress. Eyes: Conjunctivae are normal.  EOMI.  PERRLA. Head: Atraumatic. Nose: No congestion/rhinnorhea. Mouth/Throat: Mucous membranes are moist.   Neck: Normal range of motion.  Cardiovascular: Normal rate, regular rhythm. Grossly normal heart sounds.  Good peripheral circulation.  Chest wall nontender. Respiratory: Normal respiratory effort.  No retractions. Lungs CTAB. Gastrointestinal: Soft and nontender. No distention.  Genitourinary: No flank tenderness. Musculoskeletal: No lower extremity edema.  Extremities warm and well perfused.  Neurologic:  Normal speech and language.  Motor and sensory intact in all extremities.  Normal coordination with no ataxia.  Cranial nerves III through XII intact.  No gross focal neurologic deficits are appreciated.  Skin:  Skin is warm and dry. No rash noted. Psychiatric: Mood and affect are normal. Speech and behavior are normal.  ____________________________________________   LABS (all labs ordered are listed, but only abnormal results are displayed)  Labs Reviewed  BASIC METABOLIC PANEL - Abnormal; Notable for the following components:      Result Value   Glucose, Bld 100 (*)    All other components within normal limits  CBC  TROPONIN I  TROPONIN I   ____________________________________________  EKG  ED ECG REPORT I, 07/11/17, the attending physician, personally viewed and interpreted this ECG.  Date: 07/09/2017 EKG Time: 1543 Rate: 73 Rhythm: normal sinus rhythm QRS Axis: normal Intervals: normal ST/T Wave abnormalities: Poor baseline, nonspecific ST abnormalities Narrative Interpretation: no evidence of acute ischemia; no significant change when compared to EKG performed yesterday  ____________________________________________  RADIOLOGY  CXR: No  focal infiltrate or other acute findings CT head: No acute findings ____________________________________________   PROCEDURES  Procedure(s) performed: No  Procedures  Critical Care performed: No ____________________________________________   INITIAL IMPRESSION / ASSESSMENT AND PLAN / ED COURSE  Pertinent labs & imaging results that were available during my care of the patient were reviewed by me and considered in my medical decision making (see chart for details).  84 year old female with past medical history as noted above presents with resolved chest discomfort, described as pressure and associated with mild lightheadedness.  She also reported feeling dysfunction in her left arm although no frank weakness or droop over the same period, which is also now resolved.  Past medical records reviewed in Epic; I was able to confirm that the patient was seen by her primary care doctor today and referred to the ER for evaluation for possible TIA.  The patient was last seen in the ED 2 months ago for generalized weakness with negative workup.  On exam,  the vital signs are normal, and the remainder the exam is unremarkable.  Patient is extremely well-appearing, neuro exam is nonfocal, with no deficit at this time.  The remainder the exam is unremarkable.  EKG is unchanged from patient's baseline and initial labs including troponin are negative.  Differential for the chest pain includes  GERD, musculoskeletal pain, or other benign cause, with ACS is less likely etiology.  Given patient's age and comorbidities will obtain a second troponin.  The left arm symptoms could be related to a TIA so will obtain a CT head and reassess.  If patient has no recurrent symptoms she likely could be discharged home with close PMD follow-up.    ----------------------------------------- 8:31 PM on 07/09/2017 -----------------------------------------  Repeat troponin is negative.  CT shows no acute findings.  The  patient has had not had any recurrence of either the left arm symptoms or the chest pain.  I had an extensive discussion with the patient about the concern for possible TIA, and the risk of stroke which could lead to permanent brain damage, disability, or death.  Because of her age, the slightly elevated blood pressure initially, and the nature of the symptom (unilateral motor symptoms) her risk of stroke is somewhat increased.  I offered the patient to obtain an MRI in the emergency department or to observe her overnight.  The patient at this time would prefer to go home.  She states that she does not want to obtain an MRI unless is absolutely necessary, and she does not want to stay in the hospital.  She understands without doing either of these things we cannot completely rule out a TIA, and that she is at some risk for stroke.  She states that she feels comfortable with these risks, and that she would prefer to follow up with her primary care doctor closely.  The patient agrees to return for any new, worsening, recurrent symptoms.  She understands she may return anytime if she changes her mind.   ____________________________________________   FINAL CLINICAL IMPRESSION(S) / ED DIAGNOSES  Final diagnoses:  Atypical chest pain  Left arm weakness      NEW MEDICATIONS STARTED DURING THIS VISIT:  New Prescriptions   No medications on file     Note:  This document was prepared using Dragon voice recognition software and may include unintentional dictation errors.    Dionne Bucy, MD 07/09/17 2033

## 2017-07-09 NOTE — ED Notes (Signed)
Pt in NAD sitting upright on stretcher talking with family at bedside. Awaiting update from MD.

## 2017-07-09 NOTE — ED Notes (Signed)
Patient transported to CT 

## 2017-07-09 NOTE — ED Triage Notes (Addendum)
Pt arrives EMS for CP. States pressure, not pain. L sided CP. Started about 1pm today. Denies N&V, diaphoresis, SOB. Pt states L arm was numb/pressure but states better at this time. Pt is HOH. Alert, oriented. Pt has cardiac monitor noted to L upper chest, does not shock pt.

## 2017-07-09 NOTE — Discharge Instructions (Signed)
Call your doctor tomorrow to arrange close follow-up.  Return to the emergency department immediately if you have new, worsening, recurrent chest pain, difficulty breathing, numbness or weakness in the left arm or anywhere else, or any other new or worsening symptoms that concern you.

## 2017-07-09 NOTE — Assessment & Plan Note (Signed)
Had recent CT chest for LUL nodule seen on xray.  2 vessel CAD mentioned.  Saw Dr Mariah Milling yesterday.  Had holter placed.  Had chest tightness and pain today just prior to arrival to our office.  EKG - SR with non specific ST/T wave changes noted.  Was given 325mg  ECASA in office.  Oxygen placed via [place].  Pain free.  Discussed with pt regarding further w/up.  She agrees.  Transport to ER via ambulance.

## 2017-07-09 NOTE — Progress Notes (Signed)
Patient ID: Debra Hubbard, female   DOB: 09-23-1926, 84 y.o.   MRN: [id number]   Subjective:    Patient ID: Debra Hubbard, female    DOB: 02/26/1927, 84 y.o.   MRN: [id number]  HPI  Patient here as a walk in with concerns regarding chest fullness and left arm weakness.  Pt of Dr Darrick Huntsman.  She reports that she was riding in a car and felt chest pressure.  Then noticed that her left arm felt useless.  She stated she could not open her hand or lift her arm.  Then developed some chest pain.  Walked in to the office accompanied by her friend Aurther Loft.  Reports had an episode before Christmas of what sounds like a near syncopal episode.  Was evaluated in ER.  W/up reviewed.  Was seen by Dr Mariah Milling yesterday.  Has holter in place.  Reports was doing ok, until riding today.  On questioning her, nurse noted some brief confusion.  C/o chest pain.  Was given 325mg  aspirin and oxygen was placed.  Pain free now.  Able to mover her arm.  No abdominal pain.  Has been eating.  No vomiting of diarrhea.  Breathing stable.      Past Medical History:  Diagnosis Date  . Cataracts, bilateral   . Degenerative joint disease   . Gastritis    followed by Dr.  . Macular degeneration   . Squamous cell carcinoma    face, s/p excision   Past Surgical History:  Procedure Laterality Date  . APPENDECTOMY  1957  . BREAST BIOPSY     x 3, all normal  . EXPLORATORY LAPAROTOMY  1963  . OOPHORECTOMY  1957   Family History  Problem Relation Age of Onset  . Cancer Neg Hx   . Heart disease Neg Hx    Social History   Socioeconomic History  . Marital status: Widowed    Spouse name: None  . Number of children: None  . Years of education: None  . Highest education level: None  Social Needs  . Financial resource strain: None  . Food insecurity - worry: None  . Food insecurity - inability: None  . Transportation needs - medical: None  . Transportation needs - non-medical: None  Occupational History  . None    Tobacco Use  . Smoking status: Former Smoker    Last attempt to quit: 08/20/1984    Years since quitting: 32.9  . Smokeless tobacco: Never Used  Substance and Sexual Activity  . Alcohol use: No  . Drug use: No  . Sexual activity: None  Other Topics Concern  . None  Social History Narrative  . None    Outpatient Encounter Medications as of 07/09/2017  Medication Sig  . acetaminophen (TYLENOL) 500 MG tablet Take 500 mg by mouth every 6 (six) hours as needed.  . carboxymethylcellul-glycerin (OPTIVE) 0.5-0.9 % ophthalmic solution Place 1 drop into both eyes daily as needed for dry eyes.  . Multiple Vitamin (MULTIVITAMIN) tablet Take 1 tablet by mouth daily.    . Multiple Vitamins-Minerals (VISION-VITE PRESERVE PO) Take one by mouth twice a day.    No facility-administered encounter medications on file as of 07/09/2017.     Review of Systems  Constitutional: Negative for appetite change and unexpected weight change.  HENT: Negative for congestion and sinus pressure.   Respiratory: Negative for cough and shortness of breath.   Cardiovascular: Negative for leg swelling.       Chest fullness  and pain as outlined.    Gastrointestinal: Negative for abdominal pain, diarrhea, nausea and vomiting.  Genitourinary: Negative for difficulty urinating and dysuria.  Musculoskeletal: Negative for joint swelling and myalgias.  Skin: Negative for color change and rash.  Neurological: Negative for dizziness, light-headedness and headaches.  Psychiatric/Behavioral: Negative for agitation and dysphoric mood.       Objective:    Physical Exam  Constitutional: She appears well-developed and well-nourished. No distress.  HENT:  Nose: Nose normal.  Mouth/Throat: Oropharynx is clear and moist.  Neck: Neck supple. No thyromegaly present.  Cardiovascular: Normal rate and regular rhythm.  Pulmonary/Chest: Breath sounds normal. No respiratory distress. She has no wheezes.  Abdominal: Soft. Bowel  sounds are normal. There is no tenderness.  Musculoskeletal: She exhibits no edema or tenderness.  Lymphadenopathy:    She has no cervical adenopathy.  Neurological:  Grip strength normal.  Able to move left and right arm - equal.   Skin: No rash noted. No erythema.  Psychiatric: She has a normal mood and affect. Her behavior is normal.    BP (!) 144/62   Pulse 73   Temp 97.6 F (36.4 C) (Oral)   Resp 15   Wt 114 lb 9.6 oz (52 kg)   SpO2 96%   BMI 20.30 kg/m  Wt Readings from Last 3 Encounters:  07/09/17 114 lb 9.6 oz (52 kg)  07/08/17 111 lb (50.3 kg)  05/16/17 109 lb 9.6 oz (49.7 kg)     Lab Results  Component Value Date   WBC 7.5 05/02/2017   HGB 14.3 05/02/2017   HCT 43.1 05/02/2017   PLT 204 05/02/2017   GLUCOSE 100 (H) 05/02/2017   CHOL 180 12/21/2013   TRIG 109.0 12/21/2013   HDL 46.40 12/21/2013   LDLCALC 112 (H) 12/21/2013   ALT 10 12/16/2016   AST 18 12/16/2016   NA 137 05/02/2017   K 3.8 05/02/2017   CL 100 (L) 05/02/2017   CREATININE 0.57 05/02/2017   BUN 15 05/02/2017   CO2 28 05/02/2017   TSH 1.21 11/30/2014    Ct Chest Wo Contrast  Addendum Date: 05/28/2017   ADDENDUM REPORT: 05/28/2017 08:11 ADDENDUM: Evidence for pneumobilia.  Question previous biliary intervention. Electronically Signed   By: Richarda Overlie M.D.   On: 05/28/2017 08:11   Result Date: 05/28/2017 CLINICAL DATA:  Evaluate left lung nodular density. EXAM: CT CHEST WITHOUT CONTRAST TECHNIQUE: Multidetector CT imaging of the chest was performed following the standard protocol without IV contrast. COMPARISON:  Chest radiograph 05/02/2017 FINDINGS: Cardiovascular: Atherosclerotic calcifications in the left anterior descending coronary artery and right coronary artery. Atherosclerotic calcifications in the thoracic aorta without enlargement. Heart size is normal. Mediastinum/Nodes: Small left supraclavicular lymph nodes. No significant mediastinal lymphadenopathy. Limited evaluation for hilar  lymphadenopathy on this noncontrast examination. No axillary lymph node enlargement. Lungs/Pleura: Trachea and mainstem bronchi are patent. Scarring at both lung apices. No large pleural effusions. Scattered peripheral nodular and branching densities in both lungs, most prominent in the right lower lobe. No lung findings to account for the chest radiograph opacity. Upper Abdomen: Gas within the liver is likely in the biliary system. Probable cyst involving the right kidney upper pole. Musculoskeletal: No acute bone abnormality. IMPRESSION: No suspicious nodule or lesion to account for the radiographic finding. Radiographic finding likely represents overlying shadows and pleural thickening/scarring in this area. Scattered peripheral nodular and branching densities in both lungs. Findings are most compatible with post inflammatory changes. Aortic Atherosclerosis (ICD10-I70.0). Electronically Signed: By:  Richarda Overlie M.D. On: 05/28/2017 07:53       Assessment & Plan:   Problem List Items Addressed This Visit    Coronary atherosclerosis    Had recent CT chest for LUL nodule seen on xray.  2 vessel CAD mentioned.  Saw Dr Mariah Milling yesterday.  Had holter placed.  Had chest tightness and pain today just prior to arrival to our office.  EKG - SR with non specific ST/T wave changes noted.  Was given 325mg  ECASA in office.  Oxygen placed via [place].  Pain free.  Discussed with pt regarding further w/up.  She agrees.  Transport to ER via ambulance.        Left arm weakness    Pt with episode of left arm weakness described as "my left arm felt useless".  Unable to open her hand.  Unable to lift her arm.  This resolved by the time I examined her.  Nurse reported a brief episode of confusion.  Was given 325mg  aspirin as outlined.  Was not on aspirin prior to today.  Discussed the need for further evaluation and concern regarding possible TIA.  She was in agreement for transfer to ER.  To ER for evaluation - via ambulance.           Other Visit Diagnoses    Palpitations    -  Primary   Relevant Orders   EKG 12-Lead (Completed)       Dale [place], MD

## 2017-07-09 NOTE — ED Notes (Signed)
Informed RN that patient has been roomed and is ready for evaluation.  Patient in NAD at this time and call bell placed within reach.   

## 2017-07-09 NOTE — ED Notes (Signed)
Report to Allison, RN

## 2017-07-10 ENCOUNTER — Telehealth: Payer: Self-pay | Admitting: Internal Medicine

## 2017-07-10 ENCOUNTER — Telehealth: Payer: Self-pay | Admitting: Cardiovascular Disease

## 2017-07-10 NOTE — Telephone Encounter (Signed)
Patient wearing Zio monitor since 2/26 for near syncope in ED yesterday c/o chest pain and left arm weakness.  She had walked in to PCP office requesting medical attention for symptoms and was transported to [facility] via [facility]. Labs, CT and CXR. She was offered MRI and overnight observation but patient declined.  Symptoms resolved in ED and patient discharged home.  Per patient request, will route to Dr. Mariah Milling to make aware.

## 2017-07-10 NOTE — Telephone Encounter (Signed)
Etiology unclear Low dose asa 81 daily Continue to wear monitor total of 2 weeks For more chest pain, would order stress test

## 2017-07-10 NOTE — Telephone Encounter (Signed)
Noted.  Let me know if needs anything or if I need to do anything.

## 2017-07-10 NOTE — Telephone Encounter (Signed)
Followed up on patient sent to ER on 07/09/17 walkin triage seen by Dr. Lorin Picket, ED wanted to observe patient overnight patient refused , hospitalist recommended 1 week follow up with PCP called patient and scheduled for 07/16/17 at 4:30. FYI

## 2017-07-10 NOTE — Telephone Encounter (Signed)
Patient was in [facility] ER yesterday Patient would like to make nurses and Dr Mariah Milling aware so they can review the information from the visit

## 2017-07-11 NOTE — Telephone Encounter (Signed)
I spoke with the patient. She is aware of Dr. Windell Hummingbird recommendations and verbalizes understanding. I advised her to call back with any further concerns otherwise, we will call her with her monitor report when we get it back. She did state she is having some itching at the site of her ZIO- I advised her to take benadryl if she is not allergic to help with this. She is agreeable.

## 2017-07-16 ENCOUNTER — Encounter: Payer: Self-pay | Admitting: Internal Medicine

## 2017-07-16 ENCOUNTER — Ambulatory Visit (INDEPENDENT_AMBULATORY_CARE_PROVIDER_SITE_OTHER): Payer: Medicare Other | Admitting: Internal Medicine

## 2017-07-16 DIAGNOSIS — R55 Syncope and collapse: Secondary | ICD-10-CM

## 2017-07-16 DIAGNOSIS — I2510 Atherosclerotic heart disease of native coronary artery without angina pectoris: Secondary | ICD-10-CM | POA: Diagnosis not present

## 2017-07-16 DIAGNOSIS — R0789 Other chest pain: Secondary | ICD-10-CM

## 2017-07-16 MED ORDER — MOMETASONE FUROATE 0.1 % EX OINT: TOPICAL_OINTMENT | Freq: Every day | CUTANEOUS | 0 refills | Status: DC

## 2017-07-16 MED ORDER — NITROGLYCERIN 0.4 MG SL SUBL: 0.4000 mg | SUBLINGUAL_TABLET | SUBLINGUAL | 3 refills | Status: DC | PRN

## 2017-07-16 NOTE — Progress Notes (Signed)
Subjective:  Patient ID: Debra Hubbard, female    DOB: Oct 27, 1926  Age: 84 y.o. MRN: [id number]  CC: Diagnoses of Chest pain radiating to upper extremity, Atherosclerosis of native coronary artery of native heart without angina pectoris, and Near syncope were pertinent to this visit.  HPI Debra Hubbard presents for Er follow up.  This isher 2nd ER visit in 3 months. Patient was sent to ER on Feb 27 after presenting to office with chest pain and left arm weakness,  signs and symptoms concerning  AMI vs CVA.  ,  EKG and troponin negative x 2.  CT head negative   Refused admission  And refused MRI of brain  Still wearing Holter monitor placed Feb 27 by Dr Mariah Milling    CT chest noted 2 vessel CAD . Discussed today with patient   Still having episodes of chest pain and fullness in the chest lasting 5 minutes .  Occurring at rest and with mild exertion.  Does not exercise regularly. Sedentary life.   Outpatient Medications Prior to Visit  Medication Sig Dispense Refill  . acetaminophen (TYLENOL) 500 MG tablet Take 500 mg by mouth every 6 (six) hours as needed.    . carboxymethylcellul-glycerin (OPTIVE) 0.5-0.9 % ophthalmic solution Place 1 drop into both eyes daily as needed for dry eyes.    . Multiple Vitamin (MULTIVITAMIN) tablet Take 1 tablet by mouth daily.      . Multiple Vitamins-Minerals (VISION-VITE PRESERVE PO) Take one by mouth twice a day.      No facility-administered medications prior to visit.     Review of Systems;  Patient denies headache, fevers, malaise, unintentional weight loss, skin rash, eye pain, sinus congestion and sinus pain, sore throat, dysphagia,  hemoptysis , cough, dyspnea, wheezing, chest pain, palpitations, orthopnea, edema, abdominal pain, nausea, melena, diarrhea, constipation, flank pain, dysuria, hematuria, urinary  Frequency, nocturia, numbness, tingling, seizures,  Focal weakness, Loss of consciousness,  Tremor, insomnia, depression, anxiety, and  suicidal ideation.      Objective:  BP 116/68 (BP Location: Left Arm, Patient Position: Sitting, Cuff Size: Normal)   Pulse 79   Temp 97.7 F (36.5 C) (Oral)   Resp 15   Ht 5\' 3"  (1.6 m)   Wt 111 lb 12.8 oz (50.7 kg)   SpO2 95%   BMI 19.80 kg/m   BP Readings from Last 3 Encounters:  07/16/17 116/68  07/09/17 130/65  07/09/17 (!) 144/62    Wt Readings from Last 3 Encounters:  07/16/17 111 lb 12.8 oz (50.7 kg)  07/09/17 111 lb (50.3 kg)  07/09/17 114 lb 9.6 oz (52 kg)    General appearance: alert, cooperative and appears stated age Ears: normal TM's and external ear canals both ears Throat: lips, mucosa, and tongue normal; teeth and gums normal Neck: no adenopathy, no carotid bruit, supple, symmetrical, trachea midline and thyroid not enlarged, symmetric, no tenderness/mass/nodules Back: symmetric, no curvature. ROM normal. No CVA tenderness. Lungs: clear to auscultation bilaterally Heart: regular rate and rhythm, S1, S2 normal, no murmur, click, rub or gallop Abdomen: soft, non-tender; bowel sounds normal; no masses,  no organomegaly Pulses: 2+ and symmetric Skin: Skin color, texture, turgor normal. No rashes or lesions Lymph nodes: Cervical, supraclavicular, and axillary nodes normal.  No results found for: HGBA1C  Lab Results  Component Value Date   CREATININE 0.48 07/09/2017   CREATININE 0.57 05/02/2017   CREATININE 0.55 (L) 12/16/2016    Lab Results  Component Value Date   WBC  7.2 07/09/2017   HGB 14.2 07/09/2017   HCT 42.6 07/09/2017   PLT 207 07/09/2017   GLUCOSE 100 (H) 07/09/2017   CHOL 180 12/21/2013   TRIG 109.0 12/21/2013   HDL 46.40 12/21/2013   LDLCALC 112 (H) 12/21/2013   ALT 10 12/16/2016   AST 18 12/16/2016   NA 138 07/09/2017   K 3.7 07/09/2017   CL 101 07/09/2017   CREATININE 0.48 07/09/2017   BUN 14 07/09/2017   CO2 28 07/09/2017   TSH 1.21 11/30/2014    Dg Chest 2 View  Result Date: 07/09/2017 CLINICAL DATA:  Chest pain EXAM:  CHEST  2 VIEW COMPARISON:  CT 05/27/2017, radiograph 05/02/2017 FINDINGS: Electronic device over the left upper chest. No acute consolidation or effusion. Normal cardiomediastinal silhouette. Apical pleural and parenchymal scarring with minimal calcification at the left apex. No pneumothorax. IMPRESSION: No active cardiopulmonary disease. Electronically Signed   By: Debra Hubbard M.D.   On: 07/09/2017 16:15   Ct Head Wo Contrast  Result Date: 07/09/2017 CLINICAL DATA:  Left arm numbness and pressure EXAM: CT HEAD WITHOUT CONTRAST TECHNIQUE: Contiguous axial images were obtained from the base of the skull through the vertex without intravenous contrast. COMPARISON:  MRI brain 01/12/2015 FINDINGS: Brain: No acute territorial infarction, hemorrhage, or intracranial mass is visualized. Mild atrophy. Minimal small vessel ischemic change of the white matter. The ventricles are nonenlarged. Vascular: No hyperdense vessels.  Carotid vascular calcification. Skull: No depressed skull fracture Sinuses/Orbits: No acute finding. Other: None IMPRESSION: No CT evidence for acute intracranial abnormality. Atrophy and minimal small vessel ischemic changes of the white matter Electronically Signed   By: Debra Hubbard M.D.   On: 07/09/2017 18:49    Assessment & Plan:   Problem List Items Addressed This Visit    Chest pain radiating to upper extremity    Suggestive of angina.  2 vessel disease noted on CT chest .  Prn nitroglycerin  followup with [facility] for testing .      Coronary atherosclerosis    Incidental finding on CT done tp follow up on LUL nodule seen on xray.  2 vessel CAD mentioned.    Prn ntg,  Follow p with [facility]       Relevant Medications   nitroGLYCERIN (NITROSTAT) 0.4 MG SL tablet   Near syncope    Still waiting to rule out arrhythmia with Holter monitor. She has had no  repeat occurrences.       Relevant Medications   nitroGLYCERIN (NITROSTAT) 0.4 MG SL tablet    A total of 25 minutes of  face to face time was spent with patient more than half of which was spent in counselling about the above mentioned conditions, reviewing ER recordds with patient   and coordination of care   I am having Debra Hubbard start on nitroGLYCERIN and mometasone. I am also having her maintain her Multiple Vitamins-Minerals (VISION-VITE PRESERVE PO), multivitamin, carboxymethylcellul-glycerin, and acetaminophen.  Meds ordered this encounter  Medications  . nitroGLYCERIN (NITROSTAT) 0.4 MG SL tablet    Sig: Place 1 tablet (0.4 mg total) under the tongue every 5 (five) minutes as needed for chest pain.    Dispense:  50 tablet    Refill:  3  . mometasone (ELOCON) 0.1 % ointment    Sig: Apply topically daily.    Dispense:  45 g    Refill:  0    There are no discontinued medications.  Follow-up: Return in about 3 months (around 10/16/2017) for  follow up on CAD .   Sherlene Shams, MD

## 2017-07-16 NOTE — Patient Instructions (Addendum)
Your chest CT did suggest that you have heart disease because two of the arteries in your heart had placque in them.    I am prescribing sublingual nitroglycerin to place under your tongue the next time you have chest pain . You can take this medication every 5 minutes for a MAXIMUM OF 3 DOSES . IF THE PAIN does not resolve,  You should call 911  Dr Mariah Milling may decide  to do a stress test  To test your heart under stress    I have sent a new steroid ointment  [facility]

## 2017-07-19 DIAGNOSIS — I208 Other forms of angina pectoris: Secondary | ICD-10-CM | POA: Insufficient documentation

## 2017-07-19 NOTE — Assessment & Plan Note (Signed)
Incidental finding on CT done tp follow up on LUL nodule seen on xray.  2 vessel CAD mentioned.    Prn ntg,  Follow p with Mariah Milling

## 2017-07-19 NOTE — Assessment & Plan Note (Signed)
Suggestive of angina.  2 vessel disease noted on CT chest .  Prn nitroglycerin  followup with [facility] for testing .

## 2017-07-19 NOTE — Assessment & Plan Note (Signed)
Still waiting to rule out arrhythmia with Holter monitor. She has had no  repeat occurrences.

## 2017-07-20 ENCOUNTER — Emergency Department: Payer: Medicare Other

## 2017-07-20 ENCOUNTER — Observation Stay
Admission: EM | Admit: 2017-07-20 | Discharge: 2017-07-21 | Disposition: A | Discharge status: Home / Self Care | Payer: Medicare Other | Attending: Internal Medicine | Admitting: Internal Medicine

## 2017-07-20 ENCOUNTER — Other Ambulatory Visit: Payer: Self-pay

## 2017-07-20 DIAGNOSIS — H3530 Unspecified macular degeneration: Secondary | ICD-10-CM | POA: Insufficient documentation

## 2017-07-20 DIAGNOSIS — Z7982 Long term (current) use of aspirin: Secondary | ICD-10-CM | POA: Insufficient documentation

## 2017-07-20 DIAGNOSIS — M1990 Unspecified osteoarthritis, unspecified site: Secondary | ICD-10-CM | POA: Insufficient documentation

## 2017-07-20 DIAGNOSIS — Z880 Allergy status to penicillin: Secondary | ICD-10-CM | POA: Insufficient documentation

## 2017-07-20 DIAGNOSIS — I208 Other forms of angina pectoris: Secondary | ICD-10-CM | POA: Diagnosis present

## 2017-07-20 DIAGNOSIS — I200 Unstable angina: Secondary | ICD-10-CM

## 2017-07-20 DIAGNOSIS — I25110 Atherosclerotic heart disease of native coronary artery with unstable angina pectoris: Principal | ICD-10-CM | POA: Insufficient documentation

## 2017-07-20 DIAGNOSIS — R55 Syncope and collapse: Secondary | ICD-10-CM | POA: Diagnosis not present

## 2017-07-20 DIAGNOSIS — I2089 Other forms of angina pectoris: Secondary | ICD-10-CM | POA: Diagnosis present

## 2017-07-20 DIAGNOSIS — I2510 Atherosclerotic heart disease of native coronary artery without angina pectoris: Secondary | ICD-10-CM | POA: Diagnosis present

## 2017-07-20 DIAGNOSIS — Z87891 Personal history of nicotine dependence: Secondary | ICD-10-CM | POA: Insufficient documentation

## 2017-07-20 HISTORY — DX: Atherosclerotic heart disease of native coronary artery without angina pectoris: I25.10

## 2017-07-20 HISTORY — DX: Personal history of nicotine dependence: Z87.891

## 2017-07-20 HISTORY — DX: Chest pain, unspecified: R07.9

## 2017-07-20 HISTORY — DX: Syncope and collapse: R55

## 2017-07-20 LAB — BASIC METABOLIC PANEL
ANION GAP: 7 (ref 5–15)
BUN: 17 mg/dL (ref 6–20)
CALCIUM: 8.9 mg/dL (ref 8.9–10.3)
CO2: 28 mmol/L (ref 22–32)
CREATININE: 0.56 mg/dL (ref 0.44–1.00)
Chloride: 102 mmol/L (ref 101–111)
GFR calc non Af Amer: >60 mL/min (ref >60)
GLUCOSE: 163 mg/dL — AB (ref 65–99)
Potassium: 3.6 mmol/L (ref 3.5–5.1)
Sodium: 137 mmol/L (ref 135–145)

## 2017-07-20 LAB — CBC
HCT: 37.5 % (ref 35.0–47.0)
HEMOGLOBIN: 13.1 g/dL (ref 12.0–16.0)
MCH: 31.2 pg (ref 26.0–34.0)
MCHC: 35 g/dL (ref 32.0–36.0)
MCV: 89.3 fL (ref 80.0–100.0)
PLATELETS: 199 10*3/uL (ref 150–440)
RBC: 4.19 MIL/uL (ref 3.80–5.20)
RDW: 13.7 % (ref 11.5–14.5)
WBC: 7.3 10*3/uL (ref 3.6–11.0)

## 2017-07-20 LAB — TROPONIN I

## 2017-07-20 MED ORDER — ACETAMINOPHEN 650 MG RE SUPP: 650.0000 mg | Freq: Four times a day (QID) | RECTAL | Status: DC | PRN

## 2017-07-20 MED ORDER — ACETAMINOPHEN 325 MG PO TABS
650.0000 mg | ORAL_TABLET | Freq: Four times a day (QID) | ORAL | Status: DC | PRN
  Administered 2017-07-21: 325 mg via ORAL
  Filled 2017-07-20: qty 2

## 2017-07-20 MED ORDER — ONDANSETRON HCL 4 MG PO TABS: 4.0000 mg | ORAL_TABLET | Freq: Four times a day (QID) | ORAL | Status: DC | PRN

## 2017-07-20 MED ORDER — CARBOXYMETHYLCELLUL-GLYCERIN 0.5-0.9 % OP SOLN: 1.0000 [drp] | Freq: Every day | OPHTHALMIC | Status: DC | PRN

## 2017-07-20 MED ORDER — ASPIRIN 81 MG PO CHEW
324.0000 mg | CHEWABLE_TABLET | Freq: Once | ORAL | Status: AC
  Administered 2017-07-20: 324 mg via ORAL

## 2017-07-20 MED ORDER — CYCLOBENZAPRINE HCL 10 MG PO TABS: 5.0000 mg | ORAL_TABLET | Freq: Once | ORAL | Status: DC

## 2017-07-20 MED ORDER — ONDANSETRON HCL 4 MG/2ML IJ SOLN: 4.0000 mg | Freq: Four times a day (QID) | INTRAMUSCULAR | Status: DC | PRN

## 2017-07-20 MED ORDER — POLYVINYL ALCOHOL 1.4 % OP SOLN
1.0000 [drp] | OPHTHALMIC | Status: DC | PRN
  Filled 2017-07-20: qty 15

## 2017-07-20 MED ORDER — NITROGLYCERIN 0.4 MG SL SUBL
0.4000 mg | SUBLINGUAL_TABLET | SUBLINGUAL | Status: DC | PRN
  Administered 2017-07-21: 0.4 mg via SUBLINGUAL
  Filled 2017-07-20: qty 1

## 2017-07-20 MED ORDER — ENOXAPARIN SODIUM 40 MG/0.4ML ~~LOC~~ SOLN: 40.0000 mg | SUBCUTANEOUS | Status: DC

## 2017-07-20 NOTE — H&P (Signed)
[facility] [place] [facility]   PATIENT NAME: Debra Hubbard    MR#:  [id number]  DATE OF BIRTH:  06/27/1926  DATE OF ADMISSION:  07/20/2017  PRIMARY CARE PHYSICIAN: Sherlene Shams, MD   REQUESTING/REFERRING PHYSICIAN: Don Perking, MD  CHIEF COMPLAINT:   Chief Complaint  Patient presents with  . Chest Pain    HISTORY OF PRESENT ILLNESS:  Debra Hubbard  is a 84 y.o. female who presents with an episode of central chest pressure.  Patient states she was sitting on her couch at home when the discomfort started.  She did not experience any associated shortness of breath or diaphoresis, and she did not have any radiation of the pain.  However, the pain was persistent and she took a nitroglycerin that was recently given to her by her PCP after CT scan showed some coronary calcifications.  She ended up taking a second and then a third nitroglycerin and her pain eased off.  She called EMS to come to the ED for evaluation.  Here her ECG does not show any ischemic pathology, and her troponin initially is normal.  Hospitalist were called for admission and further evaluation  PAST MEDICAL HISTORY:   Past Medical History:  Diagnosis Date  . Cataracts, bilateral   . Degenerative joint disease   . Gastritis    followed by Dr. Bluford Kaufmann  . Macular degeneration   . Squamous cell carcinoma    face, s/p excision     PAST SURGICAL HISTORY:   Past Surgical History:  Procedure Laterality Date  . APPENDECTOMY  1957  . BREAST BIOPSY     x 3, all normal  . CHOLECYSTECTOMY  1989  . EXPLORATORY LAPAROTOMY  1963  . OOPHORECTOMY  1957     SOCIAL HISTORY:   Social History   Tobacco Use  . Smoking status: Former Smoker    Last attempt to quit: 08/20/1984    Years since quitting: 32.9  . Smokeless tobacco: Never Used  Substance Use Topics  . Alcohol use: No     FAMILY HISTORY:   Family History  Problem Relation Age of Onset  . Cancer Neg Hx   . Heart disease  Neg Hx    Family history reviewed and is noncontributory  DRUG ALLERGIES:   Allergies  Allergen Reactions  . Penicillins Other (See Comments)    Has patient had a PCN reaction causing immediate rash, facial/tongue/throat swelling, SOB or lightheadedness with hypotension: No Has patient had a PCN reaction causing severe rash involving mucus membranes or skin necrosis: No Has patient had a PCN reaction that required hospitalization: Yes Has patient had a PCN reaction occurring within the last 10 years: No If all of the above answers are "NO", then may proceed with Cephalosporin use.    MEDICATIONS AT HOME:   Prior to Admission medications   Medication Sig Start Date End Date Taking? Authorizing Provider  acetaminophen (TYLENOL) 500 MG tablet Take 500 mg by mouth every 6 (six) hours as needed.    [provider]  carboxymethylcellul-glycerin (OPTIVE) 0.5-0.9 % ophthalmic solution Place 1 drop into both eyes daily as needed for dry eyes.    [provider]  mometasone (ELOCON) 0.1 % ointment Apply topically daily. 07/16/17   Sherlene Shams, MD  Multiple Vitamin (MULTIVITAMIN) tablet Take 1 tablet by mouth daily.      [provider]  Multiple Vitamins-Minerals (VISION-VITE PRESERVE PO) Take one by mouth twice a day.  [provider]  nitroGLYCERIN (NITROSTAT) 0.4 MG SL tablet Place 1 tablet (0.4 mg total) under the tongue every 5 (five) minutes as needed for chest pain. 07/16/17   Sherlene Shams, MD    REVIEW OF SYSTEMS:  Review of Systems  Constitutional: Negative for chills, fever, malaise/fatigue and weight loss.  HENT: Negative for ear pain, hearing loss and tinnitus.   Eyes: Negative for blurred vision, double vision, pain and redness.  Respiratory: Negative for cough, hemoptysis and shortness of breath.   Cardiovascular: Positive for chest pain. Negative for palpitations, orthopnea and leg swelling.  Gastrointestinal: Negative for abdominal  pain, constipation, diarrhea, nausea and vomiting.  Genitourinary: Negative for dysuria, frequency and hematuria.  Musculoskeletal: Negative for back pain, joint pain and neck pain.  Skin:       No acne, rash, or lesions  Neurological: Negative for dizziness, tremors, focal weakness and weakness.  Endo/Heme/Allergies: Negative for polydipsia. Does not bruise/bleed easily.  Psychiatric/Behavioral: Negative for depression. The patient is not nervous/anxious and does not have insomnia.      VITAL SIGNS:   Vitals:   07/20/17 1925 07/20/17 1930 07/20/17 1950 07/20/17 2000  BP: (!) 112/55 (!) 112/52  (!) 104/54  Pulse: 71 68  64  Resp: 20 (!) 21  (!) 22  Temp: 97.7 F (36.5 C)     TempSrc: Oral     SpO2: 93% 93%  92%  Weight:   50.3 kg (111 lb)   Height:   5\' 3"  (1.6 m)    Wt Readings from Last 3 Encounters:  07/20/17 50.3 kg (111 lb)  07/16/17 50.7 kg (111 lb 12.8 oz)  07/09/17 50.3 kg (111 lb)    PHYSICAL EXAMINATION:  Physical Exam  Vitals reviewed. Constitutional: She is oriented to person, place, and time. She appears well-developed and well-nourished. No distress.  HENT:  Head: Normocephalic and atraumatic.  Mouth/Throat: Oropharynx is clear and moist.  Eyes: Conjunctivae and EOM are normal. Pupils are equal, round, and reactive to light. No scleral icterus.  Neck: Normal range of motion. Neck supple. No JVD present. No thyromegaly present.  Cardiovascular: Normal rate, regular rhythm and intact distal pulses. Exam reveals no gallop and no friction rub.  No murmur heard. Respiratory: Effort normal and breath sounds normal. No respiratory distress. She has no wheezes. She has no rales.  GI: Soft. Bowel sounds are normal. She exhibits no distension. There is no tenderness.  Musculoskeletal: Normal range of motion. She exhibits no edema.  No arthritis, no gout  Lymphadenopathy:    She has no cervical adenopathy.  Neurological: She is alert and oriented to person, place,  and time. No cranial nerve deficit.  No dysarthria, no aphasia  Skin: Skin is warm and dry. No rash noted. No erythema.  Psychiatric: She has a normal mood and affect. Her behavior is normal. Judgment and thought content normal.    LABORATORY PANEL:   CBC Recent Labs  Lab 07/20/17 1928  WBC 7.3  HGB 13.1  HCT 37.5  PLT 199   ------------------------------------------------------------------------------------------------------------------  Chemistries  Recent Labs  Lab 07/20/17 1928  NA 137  K 3.6  CL 102  CO2 28  GLUCOSE 163*  BUN 17  CREATININE 0.56  CALCIUM 8.9   ------------------------------------------------------------------------------------------------------------------  Cardiac Enzymes Recent Labs  Lab 07/20/17 1928  TROPONINI <0.03   ------------------------------------------------------------------------------------------------------------------  RADIOLOGY:  Dg Chest 2 View  Result Date: 07/20/2017 CLINICAL DATA:  Weakness and chest pain EXAM: CHEST - 2 VIEW COMPARISON:  07/09/2017  FINDINGS: Cardiac shadow is within normal limits. The lungs are well aerated bilaterally. No focal infiltrate or sizable effusion is seen. No bony abnormality is noted. IMPRESSION: No active cardiopulmonary disease. Electronically Signed   By: Alcide Clever M.D.   On: 07/20/2017 20:52    EKG:   Orders placed or performed during the hospital encounter of 07/20/17  . EKG 12-Lead  . EKG 12-Lead  . EKG 12-Lead  . EKG 12-Lead  . ED EKG within 10 minutes  . ED EKG within 10 minutes  . EKG 12-Lead  . EKG 12-Lead    IMPRESSION AND PLAN:  Principal Problem:   Chest pain -initial workup largely within normal limits, but her symptoms are very concerning for possible ACS.  We will trend serial troponins tonight, get an echocardiogram and a cardiology consult tomorrow. Active Problems:   Coronary atherosclerosis -seen on recent CT imaging by the patient's PCP.  Certainly raises  concern for potential coronary disease as the cause of her symptoms, see above for workup.  Chart review performed and case discussed with ED provider. Labs, imaging and/or ECG reviewed by provider and discussed with patient/family. Management plans discussed with the patient and/or family.  DVT PROPHYLAXIS: SubQ lovenox  GI PROPHYLAXIS: None  ADMISSION STATUS: Observation  CODE STATUS: Full Code Status History    This patient does not have a recorded code status. Please follow your organizational policy for patients in this situation.    Advance Directive Documentation     Most Recent Value  Type of Advance Directive  Healthcare Power of Attorney, Living will  Pre-existing out of facility DNR order (yellow form or pink MOST form)  No data  "MOST" Form in Place?  No data      TOTAL TIME TAKING CARE OF THIS PATIENT: 40 minutes.   Dyan Labarbera FIELDING 07/20/2017, 9:45 PM  [employer]  [phone]  CC: Primary care physician; Sherlene Shams, MD  Note:  This document was prepared using Dragon voice recognition software and may include unintentional dictation errors.

## 2017-07-20 NOTE — ED Triage Notes (Signed)
Pt arrives from [facility] (apartment side) via [facility] for central/L sided CP. States dizziness. Denies SOB, N&V&D. Alert, oriented, has cardiac monitor on from doctor. Pt gave herself 3 SL nitro. States that helped her pain. EMS gave 324 ASA.   Security number at [facility] in case pt DC [phone].   Pt states CP has been going on since December and has been seen 3 times here for it.

## 2017-07-20 NOTE — ED Provider Notes (Signed)
[facility] Emergency Department Provider Note  ____________________________________________  Time seen: Approximately 9:31 PM  I have reviewed the triage vital signs and the nursing notes.   HISTORY  Chief Complaint Chest Pain   HPI Debra Hubbard is a 84 y.o. female who presents for evaluation of chest pain. Patient reports that she was laying on the couch reading when she developed sudden onset of severe left-sided chest pressure associated with shortness of breath. No diaphoresis, dizziness, nausea, vomiting. She took 3 sublingual nitroglycerin with resolution of her symptoms. She denies ever having similar pain. Patient is currently with a Zio patchbeing evaluated for episodes of syncope/near syncope. She was given nitro by her PCP in case she developed CP.  She denies prior history of cardiac disease. Her father was a smoker and had heart attacks. Patient denies any history of smoking. No personal or family history of blood clots, no recent travel immobilization, no leg pain or swelling, no hemoptysis, no exogenous hormones, no history of cancer.  Past Medical History:  Diagnosis Date  . Cataracts, bilateral   . Degenerative joint disease   . Gastritis    followed by Dr. Bluford Kaufmann  . Macular degeneration   . Squamous cell carcinoma    face, s/p excision    Patient Active Problem List   Diagnosis Date Noted  . Chest pain 07/19/2017  . Left arm weakness 07/09/2017  . Coronary atherosclerosis 05/28/2017  . Near syncope 05/18/2017  . Lung nodule 05/16/2017  . Diarrhea 12/16/2016  . Preventative health care 03/13/2016  . Hearing loss 01/02/2015  . Lichen sclerosus et atrophicus of the vulva 11/30/2014  . Medicare annual wellness visit, subsequent 06/02/2012  . Allergic rhinitis 02/27/2012  . Macular degeneration 05/18/2011    Past Surgical History:  Procedure Laterality Date  . APPENDECTOMY  1957  . BREAST BIOPSY     x 3, all normal  .  CHOLECYSTECTOMY  1989  . EXPLORATORY LAPAROTOMY  1963  . OOPHORECTOMY  1957    Prior to Admission medications   Medication Sig Start Date End Date Taking? Authorizing Provider  acetaminophen (TYLENOL) 500 MG tablet Take 500 mg by mouth every 6 (six) hours as needed for mild pain.    Yes [provider]  carboxymethylcellul-glycerin (OPTIVE) 0.5-0.9 % ophthalmic solution Place 1 drop into both eyes daily as needed for dry eyes.   Yes [provider]  mometasone (ELOCON) 0.1 % ointment Apply topically daily. 07/16/17  Yes Sherlene Shams, MD  Multiple Vitamin (MULTIVITAMIN) tablet Take 1 tablet by mouth daily.     Yes [provider]  Multiple Vitamins-Minerals (VISION-VITE PRESERVE PO) Take one by mouth twice a day.    Yes [provider]  nitroGLYCERIN (NITROSTAT) 0.4 MG SL tablet Place 1 tablet (0.4 mg total) under the tongue every 5 (five) minutes as needed for chest pain. 07/16/17  Yes Sherlene Shams, MD    Allergies Penicillins  Family History  Problem Relation Age of Onset  . Cancer Neg Hx   . Heart disease Neg Hx     Social History Social History   Tobacco Use  . Smoking status: Former Smoker    Last attempt to quit: 08/20/1984    Years since quitting: 32.9  . Smokeless tobacco: Never Used  Substance Use Topics  . Alcohol use: No  . Drug use: No    Review of Systems  Constitutional: Negative for fever. Eyes: Negative for visual changes. ENT: Negative for sore  throat. Neck: No neck pain  Cardiovascular: + chest pain. Respiratory: + shortness of breath. Gastrointestinal: Negative for abdominal pain, vomiting or diarrhea. Genitourinary: Negative for dysuria. Musculoskeletal: Negative for back pain. Skin: Negative for rash. Neurological: Negative for headaches, weakness or numbness. Psych: No SI or HI  ____________________________________________   PHYSICAL EXAM:  VITAL SIGNS: ED Triage Vitals  Enc Vitals Group     BP  07/20/17 1925 (!) 112/55     Pulse Rate 07/20/17 1925 71     Resp 07/20/17 1925 20     Temp 07/20/17 1925 97.7 F (36.5 C)     Temp Source 07/20/17 1925 Oral     SpO2 07/20/17 1925 93 %     Weight 07/20/17 1950 111 lb (50.3 kg)     Height 07/20/17 1950 5\' 3"  (1.6 m)     Head Circumference --      Peak Flow --      Pain Score 07/20/17 1950 0     Pain Loc --      Pain Edu? --      Excl. in GC? --     Constitutional: Alert and oriented. Well appearing and in no apparent distress. HEENT:      Head: Normocephalic and atraumatic.         Eyes: Conjunctivae are normal. Sclera is non-icteric.       Mouth/Throat: Mucous membranes are moist.       Neck: Supple with no signs of meningismus. Cardiovascular: Regular rate and rhythm. No murmurs, gallops, or rubs. 2+ symmetrical distal pulses are present in all extremities. No JVD. Respiratory: Normal respiratory effort. Lungs are clear to auscultation bilaterally. No wheezes, crackles, or rhonchi.  Gastrointestinal: Soft, non tender, and non distended with positive bowel sounds. No rebound or guarding. Genitourinary: No CVA tenderness. Musculoskeletal: Nontender with normal range of motion in all extremities. No edema, cyanosis, or erythema of extremities. Neurologic: Normal speech and language. Face is symmetric. Moving all extremities. No gross focal neurologic deficits are appreciated. Skin: Skin is warm, dry and intact. No rash noted. Psychiatric: Mood and affect are normal. Speech and behavior are normal.  ____________________________________________   LABS (all labs ordered are listed, but only abnormal results are displayed)  Labs Reviewed  BASIC METABOLIC PANEL - Abnormal; Notable for the following components:      Result Value   Glucose, Bld 163 (*)    All other components within normal limits  MRSA PCR SCREENING  CBC  TROPONIN I  TROPONIN I  TROPONIN I  TROPONIN I  BASIC METABOLIC PANEL  CBC    ____________________________________________  EKG  ED ECG REPORT I, 09/19/17, the attending physician, personally viewed and interpreted this ECG.  Normal sinus rhythm, rate of 72, normal intervals, normal axis, slight ST depression in V4 to V6, no ST elevation. No significant changes when compared to prior from 10 days ago. ____________________________________________  RADIOLOGY  I have personally reviewed the images performed during this visit and I agree with the Radiologist's read.   Interpretation by Radiologist:  Dg Chest 2 View  Result Date: 07/20/2017 CLINICAL DATA:  Weakness and chest pain EXAM: CHEST - 2 VIEW COMPARISON:  07/09/2017 FINDINGS: Cardiac shadow is within normal limits. The lungs are well aerated bilaterally. No focal infiltrate or sizable effusion is seen. No bony abnormality is noted. IMPRESSION: No active cardiopulmonary disease. Electronically Signed   By: 07/11/2017 M.D.   On: 07/20/2017 20:52     ____________________________________________   PROCEDURES  Procedure(s) performed: None Procedures Critical Care performed:  None ____________________________________________   INITIAL IMPRESSION / ASSESSMENT AND PLAN / ED COURSE  84 y.o. female who presents for evaluation of sudden onset of chest pressure associated with shortness of breath which resolved after 3 sublingual nitros. Patient with recent CT scan done in January 2019 showing atherosclerotic calcifications in the LAD and RCA. Patient has never undergone a catheterization. Last stress test was more than 20 years ago. Currently on zio patch to eval for syncope. Presentation concerning for unstable angina. EKG unchanged from baseline. First troponin negative. Patient was given aspirin per EMS prior to arrival. Patient was admitted to the hospitalist service for further management.      As part of my medical decision making, I reviewed the following data within the electronic medical  record:  Nursing notes reviewed and incorporated, Labs reviewed , EKG interpreted , Old EKG reviewed, Radiograph reviewed , Discussed with admitting physician , Notes from prior ED visits and [place] Controlled Substance Database    Pertinent labs & imaging results that were available during my care of the patient were reviewed by me and considered in my medical decision making (see chart for details).    ____________________________________________   FINAL CLINICAL IMPRESSION(S) / ED DIAGNOSES  Final diagnoses:  Unstable angina (HCC)      NEW MEDICATIONS STARTED DURING THIS VISIT:  ED Discharge Orders    None       Note:  This document was prepared using Dragon voice recognition software and may include unintentional dictation errors.    Don Perking, [place], MD 07/21/17 [phone]

## 2017-07-20 NOTE — ED Notes (Signed)
Pt ambulatory to toilet and back by self.  

## 2017-07-20 NOTE — ED Notes (Signed)
Pt states her cardiac monitor for 2 weeks this coming Tuesday.

## 2017-07-20 NOTE — ED Notes (Signed)
Dr. Veronese at bedside 

## 2017-07-21 ENCOUNTER — Observation Stay (HOSPITAL_BASED_OUTPATIENT_CLINIC_OR_DEPARTMENT_OTHER)
Admit: 2017-07-21 | Discharge: 2017-07-21 | Disposition: A | Discharge status: Home / Self Care | Payer: Medicare Other | Attending: Internal Medicine | Admitting: Internal Medicine

## 2017-07-21 ENCOUNTER — Observation Stay (HOSPITAL_BASED_OUTPATIENT_CLINIC_OR_DEPARTMENT_OTHER): Payer: Medicare Other

## 2017-07-21 ENCOUNTER — Encounter: Payer: Self-pay | Admitting: Nurse Practitioner

## 2017-07-21 DIAGNOSIS — I2510 Atherosclerotic heart disease of native coronary artery without angina pectoris: Secondary | ICD-10-CM

## 2017-07-21 DIAGNOSIS — R0789 Other chest pain: Secondary | ICD-10-CM

## 2017-07-21 DIAGNOSIS — R55 Syncope and collapse: Secondary | ICD-10-CM

## 2017-07-21 DIAGNOSIS — I25110 Atherosclerotic heart disease of native coronary artery with unstable angina pectoris: Secondary | ICD-10-CM | POA: Diagnosis not present

## 2017-07-21 LAB — CBC
HCT: 39.4 % (ref 35.0–47.0)
Hemoglobin: 13.3 g/dL (ref 12.0–16.0)
MCH: 30.6 pg (ref 26.0–34.0)
MCHC: 33.8 g/dL (ref 32.0–36.0)
MCV: 90.5 fL (ref 80.0–100.0)
PLATELETS: 205 10*3/uL (ref 150–440)
RBC: 4.36 MIL/uL (ref 3.80–5.20)
RDW: 13.6 % (ref 11.5–14.5)
WBC: 7.8 10*3/uL (ref 3.6–11.0)

## 2017-07-21 LAB — TROPONIN I
Troponin I: <0.03 ng/mL (ref <0.03)
Troponin I: <0.03 ng/mL (ref <0.03)

## 2017-07-21 LAB — MRSA PCR SCREENING: MRSA BY PCR: NEGATIVE

## 2017-07-21 LAB — BASIC METABOLIC PANEL
Anion gap: 10 (ref 5–15)
BUN: 16 mg/dL (ref 6–20)
CHLORIDE: 101 mmol/L (ref 101–111)
CO2: 27 mmol/L (ref 22–32)
CREATININE: 0.46 mg/dL (ref 0.44–1.00)
Calcium: 8.9 mg/dL (ref 8.9–10.3)
GFR calc non Af Amer: >60 mL/min (ref >60)
Glucose, Bld: 98 mg/dL (ref 65–99)
Potassium: 3.9 mmol/L (ref 3.5–5.1)
Sodium: 138 mmol/L (ref 135–145)

## 2017-07-21 LAB — ECHOCARDIOGRAM COMPLETE
Height: 63 in
Weight: 1763.2 oz

## 2017-07-21 LAB — NM MYOCAR MULTI W/SPECT W/WALL MOTION / EF
CHL CUP NUCLEAR SRS: 8
CHL CUP NUCLEAR SSS: 1
CSEPED: 0 min
CSEPHR: 59 %
CSEPPHR: 75 {beats}/min
Estimated workload: 1 METS
Exercise duration (sec): 0 s
LV dias vol: 33 mL (ref 46–106)
LVSYSVOL: 9 mL
MPHR: 132 {beats}/min
Rest HR: 61 {beats}/min
SDS: 1
TID: 1

## 2017-07-21 MED ORDER — ASPIRIN 81 MG PO CHEW: 81.0000 mg | CHEWABLE_TABLET | Freq: Every day | ORAL | Status: DC

## 2017-07-21 MED ORDER — REGADENOSON 0.4 MG/5ML IV SOLN
0.4000 mg | Freq: Once | INTRAVENOUS | Status: DC
  Filled 2017-07-21: qty 5

## 2017-07-21 MED ORDER — TECHNETIUM TC 99M TETROFOSMIN IV KIT
28.2800 | PACK | Freq: Once | INTRAVENOUS | Status: AC | PRN
  Administered 2017-07-21: 28.28 via INTRAVENOUS

## 2017-07-21 MED ORDER — TECHNETIUM TC 99M TETROFOSMIN IV KIT
12.8300 | PACK | Freq: Once | INTRAVENOUS | Status: AC | PRN
  Administered 2017-07-21: 12.83 via INTRAVENOUS

## 2017-07-21 MED ORDER — ASPIRIN 81 MG PO CHEW: 81.0000 mg | CHEWABLE_TABLET | Freq: Every day | ORAL | 0 refills | Status: DC

## 2017-07-21 NOTE — Progress Notes (Signed)
*  PRELIMINARY RESULTS* Echocardiogram 2D Echocardiogram has been performed.  Cristela Blue 07/21/2017, 11:00 AM

## 2017-07-21 NOTE — Progress Notes (Signed)
Patient c/o chest pain to NT at approximately 0200.  This RN gave 1 SL nitro.  Pt did not want a second nitro; said pain was getting better.  Patient also given tylenol for a headache, but only wanted 1 tylenol.

## 2017-07-21 NOTE — Progress Notes (Signed)
Debra Hubbard to be D/C'd Home per MD order. Patient given discharge teaching and paperwork regarding medications, diet, follow-up appointments and activity. Patient understanding verbalized. No questions or complaints at this time. Skin condition as charted. IV and telemetry removed prior to leaving.  No further needs by Care Management/Social Work. .   An After Visit Summary was printed and given to the patient. Caregiver/family present during discharge teaching.   Patient escorted via wheelchair by volunteer.  [name]

## 2017-07-21 NOTE — Discharge Summary (Signed)
[facility] [place] [facility]   PATIENT NAME: Debra Hubbard    MR#:  [id number]  DATE OF BIRTH:  1926-10-27  DATE OF ADMISSION:  07/20/2017 ADMITTING PHYSICIAN: Oralia Manis, MD  DATE OF DISCHARGE: 07/21/2017  PRIMARY CARE PHYSICIAN: Sherlene Shams, MD    ADMISSION DIAGNOSIS:  Unstable angina (HCC) [I20.0]  DISCHARGE DIAGNOSIS:  Principal Problem:   Chest pain Active Problems:   Coronary atherosclerosis   SECONDARY DIAGNOSIS:   Past Medical History:  Diagnosis Date  . Cataracts, bilateral   . Chest pain    a. H/o neg stress test ~ 1995.  Marland Kitchen Coronary atherosclerosis    a. 04/2017 CTA chest: coronary atherosclerosis noted ->pt refused statin.  . Degenerative joint disease   . Gastritis    followed by Dr. Bluford Kaufmann  . History of tobacco abuse   . Macular degeneration    a. legally blind.  . Pre-syncope   . Squamous cell carcinoma    face, s/p excision    HOSPITAL COURSE:    84 year old female with a history of CAD and macular degeneration who presented chest pain.  1. Chest pain: Patient was ruled out for acute coronary syndrome with troponins which were all negative. Patient underwent cardiac stress testing which was normal  2.CAD; She will be started on ASA  3. Macular degeneration: She has follow up with [facility]     DISCHARGE CONDITIONS AND DIET:  Stable Cardiac diet  CONSULTS OBTAINED:  Treatment Team:  Iran Ouch, MD Dalia Heading, MD  DRUG ALLERGIES:   Allergies  Allergen Reactions  . Penicillins Other (See Comments)    Has patient had a PCN reaction causing immediate rash, facial/tongue/throat swelling, SOB or lightheadedness with hypotension: No Has patient had a PCN reaction causing severe rash involving mucus membranes or skin necrosis: No Has patient had a PCN reaction that required hospitalization: Yes Has patient had a PCN reaction occurring within the last 10 years: No If all of the above answers are "NO", then  may proceed with Cephalosporin use.    DISCHARGE MEDICATIONS:   Allergies as of 07/21/2017      Reactions   Penicillins Other (See Comments)   Has patient had a PCN reaction causing immediate rash, facial/tongue/throat swelling, SOB or lightheadedness with hypotension: No Has patient had a PCN reaction causing severe rash involving mucus membranes or skin necrosis: No Has patient had a PCN reaction that required hospitalization: Yes Has patient had a PCN reaction occurring within the last 10 years: No If all of the above answers are "NO", then may proceed with Cephalosporin use.      Medication List    TAKE these medications   acetaminophen 500 MG tablet Commonly known as:  TYLENOL Take 500 mg by mouth every 6 (six) hours as needed for mild pain.   aspirin 81 MG chewable tablet Commonly known as:  ASPIRIN CHILDRENS Chew 1 tablet (81 mg total) by mouth daily.   aspirin 81 MG chewable tablet Chew 1 tablet (81 mg total) by mouth daily.   mometasone 0.1 % ointment Commonly known as:  ELOCON Apply topically daily.   multivitamin tablet Take 1 tablet by mouth daily.   nitroGLYCERIN 0.4 MG SL tablet Commonly known as:  NITROSTAT Place 1 tablet (0.4 mg total) under the tongue every 5 (five) minutes as needed for chest pain.   OPTIVE 0.5-0.9 % ophthalmic solution Generic drug:  carboxymethylcellul-glycerin Place 1 drop into both eyes daily as needed for  dry eyes.   VISION-VITE PRESERVE PO Take one by mouth twice a day.         Today   CHIEF COMPLAINT:  No CP overnight Doing well   VITAL SIGNS:  Blood pressure (!) 103/41, pulse (!) 51, temperature 97.6 F (36.4 C), temperature source Oral, resp. rate 17, height 5\' 3"  (1.6 m), weight 50 kg (110 lb 3.2 oz), SpO2 97 %.   REVIEW OF SYSTEMS:  Review of Systems  Constitutional: Negative.  Negative for chills, fever and malaise/fatigue.  HENT: Negative.  Negative for ear discharge, ear pain, hearing loss, nosebleeds  and sore throat.   Eyes: Negative.  Negative for blurred vision and pain.  Respiratory: Negative.  Negative for cough, hemoptysis, shortness of breath and wheezing.   Cardiovascular: Negative.  Negative for chest pain, palpitations and leg swelling.  Gastrointestinal: Negative.  Negative for abdominal pain, blood in stool, diarrhea, nausea and vomiting.  Genitourinary: Negative.  Negative for dysuria.  Musculoskeletal: Negative.  Negative for back pain.  Skin: Negative.   Neurological: Negative for dizziness, tremors, speech change, focal weakness, seizures and headaches.  Endo/Heme/Allergies: Negative.  Does not bruise/bleed easily.  Psychiatric/Behavioral: Negative.  Negative for depression, hallucinations and suicidal ideas.     PHYSICAL EXAMINATION:  GENERAL:  84 y.o.-year-old patient lying in the bed with no acute distress.  NECK:  Supple, no jugular venous distention. No thyroid enlargement, no tenderness.  LUNGS: Normal breath sounds bilaterally, no wheezing, rales,rhonchi  No use of accessory muscles of respiration.  CARDIOVASCULAR: S1, S2 normal. No murmurs, rubs, or gallops.  ABDOMEN: Soft, non-tender, non-distended. Bowel sounds present. No organomegaly or mass.  EXTREMITIES: No pedal edema, cyanosis, or clubbing.  PSYCHIATRIC: The patient is alert and oriented x 3.  SKIN: No obvious rash, lesion, or ulcer.   DATA REVIEW:   CBC Recent Labs  Lab 07/21/17 0125  WBC 7.8  HGB 13.3  HCT 39.4  PLT 205    Chemistries  Recent Labs  Lab 07/21/17 0125  NA 138  K 3.9  CL 101  CO2 27  GLUCOSE 98  BUN 16  CREATININE 0.46  CALCIUM 8.9    Cardiac Enzymes Recent Labs  Lab 07/20/17 1928 07/21/17 0125  TROPONINI <0.03 <0.03    Microbiology Results  @[email or web address] @  RADIOLOGY:  Dg Chest 2 View  Result Date: 07/20/2017 CLINICAL DATA:  Weakness and chest pain EXAM: CHEST - 2 VIEW COMPARISON:  07/09/2017 FINDINGS: Cardiac shadow is within normal limits. The lungs  are well aerated bilaterally. No focal infiltrate or sizable effusion is seen. No bony abnormality is noted. IMPRESSION: No active cardiopulmonary disease. Electronically Signed   By: Alcide Clever M.D.   On: 07/20/2017 20:52      Allergies as of 07/21/2017      Reactions   Penicillins Other (See Comments)   Has patient had a PCN reaction causing immediate rash, facial/tongue/throat swelling, SOB or lightheadedness with hypotension: No Has patient had a PCN reaction causing severe rash involving mucus membranes or skin necrosis: No Has patient had a PCN reaction that required hospitalization: Yes Has patient had a PCN reaction occurring within the last 10 years: No If all of the above answers are "NO", then may proceed with Cephalosporin use.      Medication List    TAKE these medications   acetaminophen 500 MG tablet Commonly known as:  TYLENOL Take 500 mg by mouth every 6 (six) hours as needed for mild pain.   aspirin 81 MG  chewable tablet Commonly known as:  ASPIRIN CHILDRENS Chew 1 tablet (81 mg total) by mouth daily.   aspirin 81 MG chewable tablet Chew 1 tablet (81 mg total) by mouth daily.   mometasone 0.1 % ointment Commonly known as:  ELOCON Apply topically daily.   multivitamin tablet Take 1 tablet by mouth daily.   nitroGLYCERIN 0.4 MG SL tablet Commonly known as:  NITROSTAT Place 1 tablet (0.4 mg total) under the tongue every 5 (five) minutes as needed for chest pain.   OPTIVE 0.5-0.9 % ophthalmic solution Generic drug:  carboxymethylcellul-glycerin Place 1 drop into both eyes daily as needed for dry eyes.   VISION-VITE PRESERVE PO Take one by mouth twice a day.          Management plans discussed with the patient and she is in agreement. Stable for discharge home  Patient should follow up with pcp  CODE STATUS:     Code Status Orders  (From admission, onward)        Start     Ordered   07/20/17 2258  Full code  Continuous     07/20/17 2257     Code Status History    Date Active Date Inactive Code Status Order ID Comments User Context   This patient has a current code status but no historical code status.    Advance Directive Documentation     Most Recent Value  Type of Advance Directive  Living will, Healthcare Power of Attorney  Pre-existing out of facility DNR order (yellow form or pink MOST form)  No data  "MOST" Form in Place?  No data      TOTAL TIME TAKING CARE OF THIS PATIENT: 38 minutes.    Note: This dictation was prepared with Dragon dictation along with smaller phrase technology. Any transcriptional errors that result from this process are unintentional.  Charle Mclaurin M.D on 07/21/2017 at 8:47 AM  Between 7am to 6pm - Pager - [phone] After 6pm go to [email or web address] - Social research officer, government  [facility]  Office  [phone]  CC: Primary care physician; Sherlene Shams, MD

## 2017-07-21 NOTE — Consult Note (Signed)
Cardiology Consult    Patient ID: Debra Hubbard MRN: [id number], DOB/AGE: 1927-03-13   Admit date: 07/20/2017 Date of Consult: 07/21/2017  Primary Physician: Sherlene Shams, MD Primary Cardiologist: Julien Nordmann, MD Requesting Provider: D. Anne Hahn, MD  Patient Profile    Debra Hubbard is a 84 y.o. female with a history of chest pain, coronary atherosclerosis on CT 04/2017, DJD, cataracts, macular degeneration, remote tob abuse, and recurrent presyncope, who is being seen today for the evaluation of chest pain at the request of Dr. Anne Hahn.  Past Medical History   Past Medical History:  Diagnosis Date  . Cataracts, bilateral   . Chest pain    a. H/o neg stress test ~ 1995.  Marland Kitchen Coronary atherosclerosis    a. 04/2017 CTA chest: coronary atherosclerosis noted ->pt refused statin.  . Degenerative joint disease   . Gastritis    followed by Dr. Bluford Kaufmann  . History of tobacco abuse   . Macular degeneration    a. legally blind.  . Pre-syncope   . Squamous cell carcinoma    face, s/p excision    Past Surgical History:  Procedure Laterality Date  . APPENDECTOMY  1957  . BREAST BIOPSY     x 3, all normal  . CHOLECYSTECTOMY  1989  . EXPLORATORY LAPAROTOMY  1963  . OOPHORECTOMY  1957     Allergies  Allergies  Allergen Reactions  . Penicillins Other (See Comments)    Has patient had a PCN reaction causing immediate rash, facial/tongue/throat swelling, SOB or lightheadedness with hypotension: No Has patient had a PCN reaction causing severe rash involving mucus membranes or skin necrosis: No Has patient had a PCN reaction that required hospitalization: Yes Has patient had a PCN reaction occurring within the last 10 years: No If all of the above answers are "NO", then may proceed with Cephalosporin use.    History of Present Illness    84 y/o ? with the above complex PMH including remote tob abuse (quit 1986), chest pain (reportedly nl MV ~ 1995), low bp, DJD, cataracts, and  macular degeneration.  In 04/2017, she was evaluated in the ED for presyncope and profound weakness with a feeling as though "everything stopped."  She was having difficulty maneuvering in her home.  Workup in the emergency department was unremarkable with the exception of possible pulmonary nodule noted on  chest x-ray.  Patient was discharged home with recommendation for outpatient CT.  CT was performed on January 15 and did not show any suspicious nodule though atherosclerotic calcifications in the LAD and RCA were noted, along with thoracic aortic calcifications.  She followed up with Dr. Mariah Milling in February.  In the setting of presyncope and weakness, he recommended event monitoring, which has been carried out but has not yet been read.  He also recommended statin therapy however patient refused.  Debra Hubbard says that since December, she has been having intermittent chest discomfort.  This occurs a few days a week, generally at rest.  It often just last a few seconds.  She was actually seen in the emergency department on February 27 for a similar episode of chest pain that was associated with left-sided weakness.  Again, ER evaluation was unremarkable and CT of the head was negative for stroke.  She was subsequently discharged home.  Since then, she is continued have intermittent somewhat atypical chest discomfort that seems to be fleeting.  On March 10, she was lying on her couch and had sudden onset  of a more severe squeezing of her chest and torso.  There were no associated symptoms.  She took 2 sublingual nitroglycerin tablets with some, though  not complete relief.  She then called for assistance from retirement home staff and EMS was called.  She was taken to the [facility] ED where ECG and troponins were normal.  She was admitted for further evaluation.  She denies any chest pain this morning.  She is currently n.p.o. and scheduled for stress testing.  Inpatient Medications    . cyclobenzaprine  5 mg Oral  Once  . enoxaparin (LOVENOX) injection  40 mg Subcutaneous Q24H    Family History    Family History  Problem Relation Age of Onset  . Cancer Neg Hx   . Heart disease Neg Hx    indicated that her mother is deceased. She indicated that her father is deceased. She indicated that the status of her neg hx is unknown.   Social History    Social History   Socioeconomic History  . Marital status: Widowed    Spouse name: Not on file  . Number of children: Not on file  . Years of education: Not on file  . Highest education level: Not on file  Social Needs  . Financial resource strain: Not on file  . Food insecurity - worry: Not on file  . Food insecurity - inability: Not on file  . Transportation needs - medical: Not on file  . Transportation needs - non-medical: Not on file  Occupational History  . Not on file  Tobacco Use  . Smoking status: Former Smoker    Last attempt to quit: 08/20/1984    Years since quitting: 32.9  . Smokeless tobacco: Never Used  Substance and Sexual Activity  . Alcohol use: No  . Drug use: No  . Sexual activity: Not on file  Other Topics Concern  . Not on file  Social History Narrative   Lives in retirement facility.  Relatively active but does not routinely exercise.     Review of Systems    General:  No chills, fever, night sweats or weight changes.  Cardiovascular:  +++ chest pain, +++ h/o presyncope, no dyspnea on exertion, edema, orthopnea, palpitations, paroxysmal nocturnal dyspnea. Dermatological: No rash, lesions/masses Respiratory: No cough, dyspnea Urologic: No hematuria, dysuria Abdominal:   No nausea, vomiting, diarrhea, bright red blood per rectum, melena, or hematemesis Neurologic:  No visual changes, wkns, changes in mental status. All other systems reviewed and are otherwise negative except as noted above.  Physical Exam    Blood pressure (!) 103/41, pulse (!) 51, temperature 97.6 F (36.4 C), temperature source Oral, resp.  rate 17, height 5\' 3"  (1.6 m), weight 110 lb 3.2 oz (50 kg), SpO2 97 %.  General: Pleasant, NAD Psych: Normal affect. Neuro: Alert and oriented X 3. Moves all extremities spontaneously. HEENT: HOH.  Neck: Supple without bruits or JVD. Lungs:  Resp regular and unlabored, CTA. Heart: RRR no s3, s4, or murmurs. No chest wall tenderness. Abdomen: Soft, non-tender, non-distended, BS + x 4.  Extremities: No clubbing, cyanosis or edema. DP/PT/Radials 2+ and equal bilaterally.  Labs     Recent Labs    07/20/17 1928 07/21/17 0125  TROPONINI <0.03 <0.03   Lab Results  Component Value Date   WBC 7.8 07/21/2017   HGB 13.3 07/21/2017   HCT 39.4 07/21/2017   MCV 90.5 07/21/2017   PLT 205 07/21/2017    Recent Labs  Lab 07/21/17 0125  NA 138  K 3.9  CL 101  CO2 27  BUN 16  CREATININE 0.46  CALCIUM 8.9  GLUCOSE 98   Lab Results  Component Value Date   CHOL 180 12/21/2013   HDL 46.40 12/21/2013   LDLCALC 112 (H) 12/21/2013   TRIG 109.0 12/21/2013     Radiology Studies    Dg Chest 2 View  Result Date: 07/20/2017 CLINICAL DATA:  Weakness and chest pain EXAM: CHEST - 2 VIEW COMPARISON:  07/09/2017 FINDINGS: Cardiac shadow is within normal limits. The lungs are well aerated bilaterally. No focal infiltrate or sizable effusion is seen. No bony abnormality is noted. IMPRESSION: No active cardiopulmonary disease. Electronically Signed   By: Alcide Clever M.D.   On: 07/20/2017 20:52   Dg Chest 2 View  Result Date: 07/09/2017 CLINICAL DATA:  Chest pain EXAM: CHEST  2 VIEW COMPARISON:  CT 05/27/2017, radiograph 05/02/2017 FINDINGS: Electronic device over the left upper chest. No acute consolidation or effusion. Normal cardiomediastinal silhouette. Apical pleural and parenchymal scarring with minimal calcification at the left apex. No pneumothorax. IMPRESSION: No active cardiopulmonary disease. Electronically Signed   By: Jasmine Pang M.D.   On: 07/09/2017 16:15   Ct Head Wo  Contrast  Result Date: 07/09/2017 CLINICAL DATA:  Left arm numbness and pressure EXAM: CT HEAD WITHOUT CONTRAST TECHNIQUE: Contiguous axial images were obtained from the base of the skull through the vertex without intravenous contrast. COMPARISON:  MRI brain 01/12/2015 FINDINGS: Brain: No acute territorial infarction, hemorrhage, or intracranial mass is visualized. Mild atrophy. Minimal small vessel ischemic change of the white matter. The ventricles are nonenlarged. Vascular: No hyperdense vessels.  Carotid vascular calcification. Skull: No depressed skull fracture Sinuses/Orbits: No acute finding. Other: None IMPRESSION: No CT evidence for acute intracranial abnormality. Atrophy and minimal small vessel ischemic changes of the white matter Electronically Signed   By: Jasmine Pang M.D.   On: 07/09/2017 18:49    ECG & Cardiac Imaging    Regular sinus rhythm, 72, no acute ST or T changes. Telemetry: Sinus rhythm/sinus bradycardia  Assessment & Plan    1.  Chest pain: Patient admitted March 10 with chest pain and squeezing, lasting greater than an hour and  eventually relieved with sublingual nitroglycerin.  She has been having intermittent, generally brief episodes of chest discomfort occurring since December 2018.  CT of the chest in January 2018 showed coronary and aortic atherosclerosis.  Despite prolonged symptoms yesterday, troponins are normal.  ECG nonacute.  Given recurrent symptoms and known coronary calcifications, agree with Lexiscan stress testing this morning.  We will add aspirin 81 mg daily.  Patient has previously refused statin.  2.  Presyncope: Patient seen in the ED in December with presyncope and weakness and again in February with chest pain and weakness.  An event monitor was placed by our office in February.  No results as of yet, though I will recheck to our staff to see if we can pull the report.  No events on telemetry overnight.  Signed, Nicolasa Ducking, NP 07/21/2017,  8:31 AM  For questions or updates, please contact   Please consult [email or web address] for contact info under Cardiology/STEMI.

## 2017-07-21 NOTE — Progress Notes (Signed)
Per Dr. Juliene Pina, stress test normal (she has already updated patient). Will proceed with discharge. Patient's ride will be here around 3pm.

## 2017-07-21 NOTE — Care Management Obs Status (Signed)
MEDICARE OBSERVATION STATUS NOTIFICATION   Patient Details  Name: Debra Hubbard MRN: [id number] Date of Birth: 1926/12/14   Medicare Observation Status Notification Given:  No  Discharge order placed in < 24hr of being placed on observation   Eber Hong, RN 07/21/2017, 10:28 AM

## 2017-07-23 ENCOUNTER — Telehealth: Payer: Self-pay

## 2017-07-23 NOTE — Telephone Encounter (Signed)
Transition Care Management Follow-up Telephone Call   Date discharged? 07/21/2017   How have you been since you were released from the hospital? No chest pain, resting well, good appetite, no issues.   Do you understand why you were in the hospital? YES, CHEST PAIN    Do you understand the discharge instructions? YES, follow up with PCP.  Eye exam scheduled 07/28/17 at 2pm.   Where were you discharged to? HOME   Items Reviewed:  Medications reviewed: YES, TAKING WITHOUT ISSUES  Allergies reviewed: YES  Dietary changes reviewed: YES, CARDIAC DIET  Referrals reviewed: YES   Functional Questionnaire:   Activities of Daily Living (ADLs):   She states they are independent in the following:INDEPENDENT WITH ALL ADLS. States they require assistance with the following: NONE AT THIS TIME.   Any transportation issues/concerns?: NONE AT THIS TIME.   Any patient concerns? NONE AT THIS TIME.   Confirmed importance and date/time of follow-up visits scheduled YES APPOINTMENT CONFIRMED 07/28/17 AT 11:00.  Provider Appointment booked with DR. Darrick Huntsman, PCP.  Confirmed with patient if condition begins to worsen call PCP or go to the ER.  Patient was given the office number and encouraged to call back with question or concerns.  : YES.

## 2017-07-28 ENCOUNTER — Ambulatory Visit (INDEPENDENT_AMBULATORY_CARE_PROVIDER_SITE_OTHER): Payer: Medicare Other | Admitting: Internal Medicine

## 2017-07-28 ENCOUNTER — Encounter: Payer: Self-pay | Admitting: Internal Medicine

## 2017-07-28 VITALS — BP 124/62 | HR 74 | Temp 97.5°F | Resp 15 | Ht 63.0 in | Wt 111.6 lb

## 2017-07-28 DIAGNOSIS — Z09 Encounter for follow-up examination after completed treatment for conditions other than malignant neoplasm: Secondary | ICD-10-CM | POA: Diagnosis not present

## 2017-07-28 DIAGNOSIS — Z79899 Other long term (current) drug therapy: Secondary | ICD-10-CM | POA: Diagnosis not present

## 2017-07-28 DIAGNOSIS — R079 Chest pain, unspecified: Secondary | ICD-10-CM

## 2017-07-28 DIAGNOSIS — R55 Syncope and collapse: Secondary | ICD-10-CM | POA: Diagnosis not present

## 2017-07-28 LAB — COMPREHENSIVE METABOLIC PANEL
ALBUMIN: 4.1 g/dL (ref 3.5–5.2)
ALK PHOS: 80 U/L (ref 39–117)
ALT: 13 U/L (ref 0–35)
AST: 19 U/L (ref 0–37)
BILIRUBIN TOTAL: 0.5 mg/dL (ref 0.2–1.2)
BUN: 17 mg/dL (ref 6–23)
CALCIUM: 10 mg/dL (ref 8.4–10.5)
CO2: 31 mEq/L (ref 19–32)
Chloride: 98 mEq/L (ref 96–112)
Creatinine, Ser: 0.56 mg/dL (ref 0.40–1.20)
GFR: 108.49 mL/min (ref >60.00)
GLUCOSE: 108 mg/dL — AB (ref 70–99)
Potassium: 4 mEq/L (ref 3.5–5.1)
Sodium: 136 mEq/L (ref 135–145)
TOTAL PROTEIN: 7.6 g/dL (ref 6.0–8.3)

## 2017-07-28 MED ORDER — ATORVASTATIN CALCIUM 20 MG PO TABS: 20.0000 mg | ORAL_TABLET | Freq: Every day | ORAL | 3 refills | Status: DC

## 2017-07-28 MED ORDER — ISOSORBIDE MONONITRATE ER 30 MG PO TB24: 30.0000 mg | ORAL_TABLET | Freq: Every day | ORAL | 6 refills | Status: DC

## 2017-07-28 MED ORDER — COENZYME Q10 30 MG PO CAPS
30.0000 mg | ORAL_CAPSULE | Freq: Three times a day (TID) | ORAL | 1 refills | Status: DC
Start: 2017-07-28 — End: 2018-09-08

## 2017-07-28 NOTE — Patient Instructions (Addendum)
I recommend starting  a medication called Imdur  To prevent your recurrent chest pain. Take it once a day EVERY DAY    I also recommend a trial of atorvastatin  (Lipitor) to stabilize the plaque in your arteries and lower  your cholesterol   You Take the co Q 10 with the Lipitor to help you tolerate it better  You can try taking magnesium supplements or tonic water at bedtime to prevent cramps   [email or web address] a great variety of socks with compression to keep blood pressure from dropping when you stand up

## 2017-07-28 NOTE — Progress Notes (Signed)
Subjective:  Patient ID: Debra Hubbard, female    DOB: 1926/05/25  Age: 84 y.o. MRN: [id number]  CC: The primary encounter diagnosis was Long-term use of high-risk medication. Diagnoses of Chest pain, unspecified type, Near syncope, and Hospital discharge follow-up were also pertinent to this visit.  HPI AILY TZENG presents for HOSPITAL FOLLOW UP,. Readmitted to Patient was readmitted to [facility] under observation status on March 10 with chest pain  Suggestive of  [place] .  She had taken 3 SL NTG at home for chest pain and called EMS>. She ruled out for AMI with serial troponins and was evaluated with a chemical stress test which was nondiagnostic.  ECHO noted normal EF,  No WMA, and moderate TR.  She was discharged home on  March 11 with aspirin added to medication regimen .   She is accompanied by her daughter today.  Reports that she continues to have episodes of chest pain  On a daily basis,  But has not taken any more nitroglycerin tablets , even though they helped, because they gave her a headache.    Has not seen cardiology or made an appointment to discuss results of 30 day Holter monitor.    30 day  Holter monitor tracings in chart,  Normal sinus except for infrequent SVT short runs longest was 16 beats.   Reviewed raw data with patient and daughter today.  No significant arrhythmias noted  .    Outpatient Medications Prior to Visit  Medication Sig Dispense Refill  . acetaminophen (TYLENOL) 500 MG tablet Take 500 mg by mouth every 6 (six) hours as needed for mild pain.     Marland Kitchen aspirin 81 MG chewable tablet Chew 1 tablet (81 mg total) by mouth daily.    . carboxymethylcellul-glycerin (OPTIVE) 0.5-0.9 % ophthalmic solution Place 1 drop into both eyes daily as needed for dry eyes.    . mometasone (ELOCON) 0.1 % ointment Apply topically daily. 45 g 0  . Multiple Vitamin (MULTIVITAMIN) tablet Take 1 tablet by mouth daily.      . Multiple Vitamins-Minerals (VISION-VITE PRESERVE PO) Take one  by mouth twice a day.     . nitroGLYCERIN (NITROSTAT) 0.4 MG SL tablet Place 1 tablet (0.4 mg total) under the tongue every 5 (five) minutes as needed for chest pain. 50 tablet 3  . aspirin (ASPIRIN CHILDRENS) 81 MG chewable tablet Chew 1 tablet (81 mg total) by mouth daily. (Patient not taking: Reported on 07/28/2017) 120 tablet 0   No facility-administered medications prior to visit.     Review of Systems;  Patient denies headache, fevers, malaise, unintentional weight loss, skin rash, eye pain, sinus congestion and sinus pain, sore throat, dysphagia,  hemoptysis , cough, dyspnea, wheezing, chest pain, palpitations, orthopnea, edema, abdominal pain, nausea, melena, diarrhea, constipation, flank pain, dysuria, hematuria, urinary  Frequency, nocturia, numbness, tingling, seizures,  Focal weakness, Loss of consciousness,  Tremor, insomnia, depression, anxiety, and suicidal ideation.      Objective:  BP 124/62 (BP Location: Left Arm, Patient Position: Sitting, Cuff Size: Normal)   Pulse 74   Temp (!) 97.5 F (36.4 C) (Oral)   Resp 15   Ht 5\' 3"  (1.6 m)   Wt 111 lb 9.6 oz (50.6 kg)   SpO2 93%   BMI 19.77 kg/m   BP Readings from Last 3 Encounters:  07/28/17 124/62  07/21/17 (!) 121/56  07/16/17 116/68    Wt Readings from Last 3 Encounters:  07/28/17 111 lb 9.6 oz (  50.6 kg)  07/20/17 110 lb 3.2 oz (50 kg)  07/16/17 111 lb 12.8 oz (50.7 kg)    General appearance: alert, cooperative and appears stated age Ears: normal TM's and external ear canals both ears Throat: lips, mucosa, and tongue normal; teeth and gums normal Neck: no adenopathy, no carotid bruit, supple, symmetrical, trachea midline and thyroid not enlarged, symmetric, no tenderness/mass/nodules Back: symmetric, no curvature. ROM normal. No CVA tenderness. Lungs: clear to auscultation bilaterally Heart: regular rate and rhythm, S1, S2 normal, no murmur, click, rub or gallop Abdomen: soft, non-tender; bowel sounds normal;  no masses,  no organomegaly Pulses: 2+ and symmetric Skin: Skin color, texture, turgor normal. No rashes or lesions Lymph nodes: Cervical, supraclavicular, and axillary nodes normal.  No results found for: HGBA1C  Lab Results  Component Value Date   CREATININE 0.56 07/28/2017   CREATININE 0.46 07/21/2017   CREATININE 0.56 07/20/2017    Lab Results  Component Value Date   WBC 7.8 07/21/2017   HGB 13.3 07/21/2017   HCT 39.4 07/21/2017   PLT 205 07/21/2017   GLUCOSE 108 (H) 07/28/2017   CHOL 180 12/21/2013   TRIG 109.0 12/21/2013   HDL 46.40 12/21/2013   LDLCALC 112 (H) 12/21/2013   ALT 13 07/28/2017   AST 19 07/28/2017   NA 136 07/28/2017   K 4.0 07/28/2017   CL 98 07/28/2017   CREATININE 0.56 07/28/2017   BUN 17 07/28/2017   CO2 31 07/28/2017   TSH 1.21 11/30/2014    Nm Myocar Multi W/spect W/wall Motion / Ef  Result Date: 07/21/2017  There was no ST segment deviation noted during stress.  The study is normal.  This is a low risk study.  The left ventricular ejection fraction is hyperdynamic (>65%).     Assessment & Plan:   Problem List Items Addressed This Visit    Near syncope    Etiology does not appear to be cardiogenic based on results of heart monitor and ECHO. Marland Kitchen Symptoms have not recurrred       Relevant Medications   isosorbide mononitrate (IMDUR) 30 MG 24 hr tablet   atorvastatin (LIPITOR) 20 MG tablet   Hospital discharge follow-up    Patient is stable post discharge and has no new issues or questions about discharge plans at the visit today for hospital follow up.  I have reviewed the records from the hospital admission in detail with patient today.      Chest pain    Recurrent,  With 2 vessel disease noted on prior noninvasive imaging, and non diagnostic myoview.  Will start low dose Imdur  .  Beta blocker considered but not prescribed given bradycardic episodes noted on 30 day monitor for arrhythmias.        Other Visit Diagnoses     Long-term use of high-risk medication    -  Primary   Relevant Orders   Comprehensive metabolic panel (Completed)      I am having Debra Hubbard start on isosorbide mononitrate, atorvastatin, and co-enzyme Q-10. I am also having her maintain her Multiple Vitamins-Minerals (VISION-VITE PRESERVE PO), multivitamin, carboxymethylcellul-glycerin, acetaminophen, nitroGLYCERIN, mometasone, and aspirin.  Meds ordered this encounter  Medications  . isosorbide mononitrate (IMDUR) 30 MG 24 hr tablet    Sig: Take 1 tablet (30 mg total) by mouth daily.    Dispense:  30 tablet    Refill:  6  . atorvastatin (LIPITOR) 20 MG tablet    Sig: Take 1 tablet (20 mg total) by  mouth daily.    Dispense:  90 tablet    Refill:  3    DO NOT DELIVER, PATIENT WILL PICK UP  . co-enzyme Q-10 30 MG capsule    Sig: Take 1 capsule (30 mg total) by mouth 3 (three) times daily.    Dispense:  90 capsule    Refill:  1    DO NOT DELIVER,  PATIENT WILL PICK UP ALL 3 MEDS    Medications Discontinued During This Encounter  Medication Reason  . aspirin (ASPIRIN CHILDRENS) 81 MG chewable tablet Duplicate    Follow-up: Return in about 6 months (around 01/28/2018) for CMET 4 weeks  nonfasting oK .   Sherlene Shams, MD

## 2017-07-29 DIAGNOSIS — Z09 Encounter for follow-up examination after completed treatment for conditions other than malignant neoplasm: Secondary | ICD-10-CM | POA: Insufficient documentation

## 2017-07-29 NOTE — Assessment & Plan Note (Signed)
Patient is stable post discharge and has no new issues or questions about discharge plans at the visit today for hospital follow up.  I have reviewed the records from the hospital admission in detail with patient today. 

## 2017-07-29 NOTE — Assessment & Plan Note (Signed)
Recurrent,  With 2 vessel disease noted on prior noninvasive imaging, and non diagnostic myoview.  Will start low dose Imdur  .  Beta blocker considered but not prescribed given bradycardic episodes noted on 30 day monitor for arrhythmias.

## 2017-07-29 NOTE — Assessment & Plan Note (Signed)
Etiology does not appear to be cardiogenic based on results of heart monitor and ECHO. Marland Kitchen Symptoms have not recurrred

## 2017-08-01 ENCOUNTER — Encounter: Payer: Self-pay | Admitting: *Deleted

## 2017-08-21 ENCOUNTER — Telehealth: Payer: Self-pay | Admitting: Radiology

## 2017-08-21 DIAGNOSIS — I25118 Atherosclerotic heart disease of native coronary artery with other forms of angina pectoris: Secondary | ICD-10-CM

## 2017-08-21 NOTE — Telephone Encounter (Signed)
Pt coming in Monday for labs, please place future orders. Thank you.  

## 2017-08-21 NOTE — Telephone Encounter (Signed)
Ordered,,  If she is not fasting., you can d/c the lipid panel and just do the comp met

## 2017-08-21 NOTE — Addendum Note (Signed)
Addended by: Sherlene Shams on: 08/21/2017 05:13 PM   Modules accepted: Orders

## 2017-08-25 ENCOUNTER — Other Ambulatory Visit (INDEPENDENT_AMBULATORY_CARE_PROVIDER_SITE_OTHER): Payer: Medicare Other

## 2017-08-25 DIAGNOSIS — I25118 Atherosclerotic heart disease of native coronary artery with other forms of angina pectoris: Secondary | ICD-10-CM | POA: Diagnosis not present

## 2017-08-26 LAB — LIPID PANEL
CHOL/HDL RATIO: 2
CHOLESTEROL: 97 mg/dL (ref 0–200)
HDL: 42 mg/dL (ref >39.00)
LDL Cholesterol: 39 mg/dL (ref 0–99)
NonHDL: 54.57
Triglycerides: 76 mg/dL (ref 0.0–149.0)
VLDL: 15.2 mg/dL (ref 0.0–40.0)

## 2017-08-26 LAB — COMPREHENSIVE METABOLIC PANEL
ALBUMIN: 4.2 g/dL (ref 3.5–5.2)
ALT: 11 U/L (ref 0–35)
AST: 19 U/L (ref 0–37)
Alkaline Phosphatase: 84 U/L (ref 39–117)
BILIRUBIN TOTAL: 0.6 mg/dL (ref 0.2–1.2)
BUN: 12 mg/dL (ref 6–23)
CO2: 30 mEq/L (ref 19–32)
CREATININE: 0.65 mg/dL (ref 0.40–1.20)
Calcium: 9.8 mg/dL (ref 8.4–10.5)
Chloride: 100 mEq/L (ref 96–112)
GFR: 91.33 mL/min (ref >60.00)
Glucose, Bld: 90 mg/dL (ref 70–99)
Potassium: 4.7 mEq/L (ref 3.5–5.1)
SODIUM: 136 meq/L (ref 135–145)
Total Protein: 7.6 g/dL (ref 6.0–8.3)

## 2017-10-17 ENCOUNTER — Ambulatory Visit: Payer: Medicare Other | Admitting: Internal Medicine

## 2017-10-30 ENCOUNTER — Telehealth: Payer: Self-pay

## 2017-10-30 NOTE — Telephone Encounter (Signed)
Copied from CRM [id number]. Topic: General - Other >> Oct 30, 2017  1:30 PM Darletta Moll L wrote: Reason for CRM: Patient would like a call back from Dr. Melina Schools cma to ask some questions about the atorvastatin (LIPITOR) 20 MG tablet  she is taking. She declined to provide any additional information.

## 2017-11-03 NOTE — Telephone Encounter (Signed)
Pt wants to let you know that she has discontinued the lipitor due to some side effects. Pt stated that she has been experiencing some short term memory loss, and headaches. Pt stated that she stopped the medication about 5 days ago. Asked the pt if her symptoms had resolved since stopping and she stated that she "doesn't think it has been long enough to fully get out of my system". Pt stated that she has had several occassions where she hasn't been able to remember if she took her medication or not, she also stated that one time she walked into the bathroom picked up the toothbrush but couldn't remember what she was supposed to do with the toothbrush.

## 2017-11-03 NOTE — Telephone Encounter (Signed)
Ok,  I agree with stopping the medication.  It may take up to a month to see if the memory loss and headaches were truly due to the medications.

## 2017-11-04 NOTE — Telephone Encounter (Signed)
Spoke with pt to let her know that Dr. Darrick Huntsman was ok with her stopping the atorvastatin but that it may take a month to figure out if the side effects were coming from the medication. The pt gave a verbal understanding. Pt also wants to let Dr. Darrick Huntsman know that she has started walking in the mornings and is enjoying it.

## 2017-11-17 ENCOUNTER — Ambulatory Visit: Payer: Medicare Other | Admitting: Internal Medicine

## 2018-01-28 ENCOUNTER — Ambulatory Visit (INDEPENDENT_AMBULATORY_CARE_PROVIDER_SITE_OTHER): Payer: Medicare Other | Admitting: Internal Medicine

## 2018-01-28 VITALS — BP 106/70 | HR 63 | Temp 97.6°F | Ht 63.0 in | Wt 107.4 lb

## 2018-01-28 DIAGNOSIS — I10 Essential (primary) hypertension: Secondary | ICD-10-CM

## 2018-01-28 DIAGNOSIS — I2510 Atherosclerotic heart disease of native coronary artery without angina pectoris: Secondary | ICD-10-CM

## 2018-01-28 DIAGNOSIS — I2089 Other forms of angina pectoris: Secondary | ICD-10-CM

## 2018-01-28 DIAGNOSIS — E7800 Pure hypercholesterolemia, unspecified: Secondary | ICD-10-CM

## 2018-01-28 DIAGNOSIS — Z23 Encounter for immunization: Secondary | ICD-10-CM

## 2018-01-28 DIAGNOSIS — K5904 Chronic idiopathic constipation: Secondary | ICD-10-CM | POA: Diagnosis not present

## 2018-01-28 DIAGNOSIS — R5383 Other fatigue: Secondary | ICD-10-CM

## 2018-01-28 DIAGNOSIS — I200 Unstable angina: Secondary | ICD-10-CM

## 2018-01-28 DIAGNOSIS — R634 Abnormal weight loss: Secondary | ICD-10-CM

## 2018-01-28 DIAGNOSIS — Z13820 Encounter for screening for osteoporosis: Secondary | ICD-10-CM

## 2018-01-28 DIAGNOSIS — E559 Vitamin D deficiency, unspecified: Secondary | ICD-10-CM | POA: Diagnosis not present

## 2018-01-28 DIAGNOSIS — E538 Deficiency of other specified B group vitamins: Secondary | ICD-10-CM

## 2018-01-28 DIAGNOSIS — I208 Other forms of angina pectoris: Secondary | ICD-10-CM

## 2018-01-28 LAB — COMPREHENSIVE METABOLIC PANEL
ALT: 10 U/L (ref 0–35)
AST: 17 U/L (ref 0–37)
Albumin: 4.3 g/dL (ref 3.5–5.2)
Alkaline Phosphatase: 93 U/L (ref 39–117)
BILIRUBIN TOTAL: 0.8 mg/dL (ref 0.2–1.2)
BUN: 13 mg/dL (ref 6–23)
CALCIUM: 9.9 mg/dL (ref 8.4–10.5)
CHLORIDE: 100 meq/L (ref 96–112)
CO2: 33 meq/L — AB (ref 19–32)
Creatinine, Ser: 0.54 mg/dL (ref 0.40–1.20)
GFR: 113.01 mL/min (ref >60.00)
GLUCOSE: 91 mg/dL (ref 70–99)
Potassium: 4.1 mEq/L (ref 3.5–5.1)
SODIUM: 139 meq/L (ref 135–145)
Total Protein: 7.7 g/dL (ref 6.0–8.3)

## 2018-01-28 LAB — VITAMIN B12: VITAMIN B 12: 196 pg/mL — AB (ref 211–911)

## 2018-01-28 LAB — TSH: TSH: 1.47 u[IU]/mL (ref 0.35–4.50)

## 2018-01-28 LAB — MAGNESIUM: MAGNESIUM: 2.2 mg/dL (ref 1.5–2.5)

## 2018-01-28 LAB — VITAMIN D 25 HYDROXY (VIT D DEFICIENCY, FRACTURES): VITD: 21.82 ng/mL — AB (ref 30.00–100.00)

## 2018-01-28 MED ORDER — ISOSORBIDE MONONITRATE ER 30 MG PO TB24: 30.0000 mg | ORAL_TABLET | Freq: Every day | ORAL | 6 refills | Status: DC

## 2018-01-28 NOTE — Patient Instructions (Addendum)
  I want you to resume taking Isosorbide mononitrate ("Imdur")    To prevent  Your chest pain    You can lower your cholesterol using Red Yeast     I strongly recommend that you get the TDaP vaccine  (tetanus/diphtheria/pertussis)  at [facility] .  It is good for ten years for protection against tetanus,  And for lifetime protection against  diptheria and whooping cough [place]   600 mg twice daily    I recommend taking one calcium supplement daily ,  600 mg and getting  The rest throught your diet    You have lost 4 lbs since your last visit .  I want you to drink a Boost,  Or Ensure  daily

## 2018-01-28 NOTE — Progress Notes (Signed)
Subjective:  Patient ID: Debra Hubbard, female    DOB: 1926/11/08  Age: 84 y.o. MRN: [id number]  CC: The primary encounter diagnosis was Pure hypercholesterolemia. Diagnoses of Encounter for immunization, Essential hypertension, Chronic idiopathic constipation, Vitamin D deficiency, Screening for osteoporosis, Fatigue, unspecified type, Atherosclerosis of native coronary artery of native heart without angina pectoris, Stable angina (HCC), B12 deficiency, and Weight loss, unintentional were also pertinent to this visit.  HPI Debra Hubbard presents for  Preventive health exam and follow up on CAD/PAD, recurrent episodes of chest pain with prior admission for [place] ,   daughter Debra Hubbard present . Imdur prescribed at last visit for management of chest pain  .  still having occasional episodes of chest pain.   She is no longer taking it,  Not sure why,  Thinks she may have stopped taking it when she stopped taking the atorvastatin after 2 months because it was causing memory loss and apraxia.  Very hard of hearing despite use of hearing aids    Last DEX A at OSH in 2006  Defers mammogram  Due to age   Wt loss of 4 lbs  Noted.  Denies loss of appetite.    " I  care too much"  Feels a lot of stress worrying about her deceased sister 's  grown children  Who appear to be financially irresponsible .   constipated chronically,  Has a bm every other day.  drinking fruit juices and eats prunes daily       Outpatient Medications Prior to Visit  Medication Sig Dispense Refill  . acetaminophen (TYLENOL) 500 MG tablet Take 500 mg by mouth every 6 (six) hours as needed for mild pain.     Marland Kitchen aspirin 81 MG chewable tablet Chew 1 tablet (81 mg total) by mouth daily.    . carboxymethylcellul-glycerin (OPTIVE) 0.5-0.9 % ophthalmic solution Place 1 drop into both eyes daily as needed for dry eyes.    Marland Kitchen co-enzyme Q-10 30 MG capsule Take 1 capsule (30 mg total) by mouth 3 (three) times daily. 90 capsule 1  .  mometasone (ELOCON) 0.1 % ointment Apply topically daily. 45 g 0  . Multiple Vitamin (MULTIVITAMIN) tablet Take 1 tablet by mouth daily.      . Multiple Vitamins-Minerals (VISION-VITE PRESERVE PO) Take one by mouth twice a day.     Marland Kitchen atorvastatin (LIPITOR) 20 MG tablet Take 1 tablet (20 mg total) by mouth daily. 90 tablet 3  . isosorbide mononitrate (IMDUR) 30 MG 24 hr tablet Take 1 tablet (30 mg total) by mouth daily. 30 tablet 6  . nitroGLYCERIN (NITROSTAT) 0.4 MG SL tablet Place 1 tablet (0.4 mg total) under the tongue every 5 (five) minutes as needed for chest pain. 50 tablet 3   No facility-administered medications prior to visit.     Review of Systems;  Patient denies headache, fevers, malaise, unintentional weight loss, skin rash, eye pain, sinus congestion and sinus pain, sore throat, dysphagia,  hemoptysis , cough, dyspnea, wheezing, chest pain, palpitations, orthopnea, edema, abdominal pain, nausea, melena, diarrhea, constipation, flank pain, dysuria, hematuria, urinary  Frequency, nocturia, numbness, tingling, seizures,  Focal weakness, Loss of consciousness,  Tremor, insomnia, depression, anxiety, and suicidal ideation.      Objective:  BP 106/70   Pulse 63   Temp 97.6 F (36.4 C) (Oral)   Ht 5\' 3"  (1.6 m)   Wt 107 lb 6.4 oz (48.7 kg)   SpO2 93%   BMI 19.03 kg/m  BP Readings from Last 3 Encounters:  01/28/18 106/70  07/28/17 124/62  07/21/17 (!) 121/56    Wt Readings from Last 3 Encounters:  01/28/18 107 lb 6.4 oz (48.7 kg)  07/28/17 111 lb 9.6 oz (50.6 kg)  07/20/17 110 lb 3.2 oz (50 kg)    General appearance: alert, cooperative and appears stated age Ears: normal TM's and external ear canals both ears Throat: lips, mucosa, and tongue normal; teeth and gums normal Neck: no adenopathy, no carotid bruit, supple, symmetrical, trachea midline and thyroid not enlarged, symmetric, no tenderness/mass/nodules Back: symmetric, no curvature. ROM normal. No CVA  tenderness. Lungs: clear to auscultation bilaterally Heart: regular rate and rhythm, S1, S2 normal, no murmur, click, rub or gallop Abdomen: soft, non-tender; bowel sounds normal; no masses,  no organomegaly Pulses: 2+ and symmetric Skin: Skin color, texture, turgor normal. No rashes or lesions Lymph nodes: Cervical, supraclavicular, and axillary nodes normal.  No results found for: HGBA1C  Lab Results  Component Value Date   CREATININE 0.54 01/28/2018   CREATININE 0.65 08/25/2017   CREATININE 0.56 07/28/2017    Lab Results  Component Value Date   WBC 7.8 07/21/2017   HGB 13.3 07/21/2017   HCT 39.4 07/21/2017   PLT 205 07/21/2017   GLUCOSE 91 01/28/2018   CHOL 97 08/25/2017   TRIG 76.0 08/25/2017   HDL 42.00 08/25/2017   LDLCALC 39 08/25/2017   ALT 10 01/28/2018   AST 17 01/28/2018   NA 139 01/28/2018   K 4.1 01/28/2018   CL 100 01/28/2018   CREATININE 0.54 01/28/2018   BUN 13 01/28/2018   CO2 33 (H) 01/28/2018   TSH 1.47 01/28/2018    Nm Myocar Multi W/spect W/wall Motion / Ef  Result Date: 07/21/2017  There was no ST segment deviation noted during stress.  The study is normal.  This is a low risk study.  The left ventricular ejection fraction is hyperdynamic (>65%).     Assessment & Plan:   Problem List Items Addressed This Visit    B12 deficiency    Her B12 is low  Patient advised to come in for b12 injections weekly for 3 weeks, followed by monthly injections thereafter       Relevant Orders   Intrinsic Factor Antibodies   Coronary atherosclerosis    Incidental finding on CT done tp follow up on LUL nodule seen on xray.  2 vessel CAD mentioned.    Prn ntg,  Resume Imdur, declines statin due to prior adverse effect on memory .  Follow up with [facility]       Relevant Medications   isosorbide mononitrate (IMDUR) 30 MG 24 hr tablet   Screening for osteoporosis    Recommended DEXA and treatment if indicated.       Relevant Orders   DG Bone Density    Stable angina (HCC)    Recurrent,  With 2 vessel disease noted on prior noninvasive imaging, and non diagnostic myoview.  Will resume  low dose Imdur  .  Beta blocker considered but not prescribed given bradycardic episodes noted on 30 day monitor for arrhythmias.       Relevant Medications   isosorbide mononitrate (IMDUR) 30 MG 24 hr tablet   Weight loss, unintentional     I have reviewed her diet and recommended that she increase her protein and fat intake using nutritional supplements. Samples given of Ensure       Other Visit Diagnoses    Pure hypercholesterolemia    -  Primary   Relevant Medications   isosorbide mononitrate (IMDUR) 30 MG 24 hr tablet   Encounter for immunization       Relevant Orders   Flu vaccine HIGH DOSE PF (Completed)   Essential hypertension       Relevant Medications   isosorbide mononitrate (IMDUR) 30 MG 24 hr tablet   Other Relevant Orders   Comprehensive metabolic panel (Completed)   Chronic idiopathic constipation       Relevant Orders   TSH (Completed)   Magnesium (Completed)   Vitamin D deficiency       Relevant Orders   VITAMIN D 25 Hydroxy (Vit-D Deficiency, Fractures) (Completed)   Fatigue, unspecified type       Relevant Orders   B12 (Completed)     A total of 40 minutes was spent with patient more than half of which was spent in counseling patient on the above mentioned issues , reviewing and explaining recent labs and imaging studies done, and coordination of care.  I have discontinued Debra Hoops. Hubbard's nitroGLYCERIN and atorvastatin. I am also having her maintain her Multiple Vitamins-Minerals (VISION-VITE PRESERVE PO), multivitamin, carboxymethylcellul-glycerin, acetaminophen, mometasone, aspirin, co-enzyme Q-10, and isosorbide mononitrate.  Meds ordered this encounter  Medications  . isosorbide mononitrate (IMDUR) 30 MG 24 hr tablet    Sig: Take 1 tablet (30 mg total) by mouth daily.    Dispense:  30 tablet    Refill:  6     Medications Discontinued During This Encounter  Medication Reason  . isosorbide mononitrate (IMDUR) 30 MG 24 hr tablet Patient Preference  . atorvastatin (LIPITOR) 20 MG tablet Patient Preference  . nitroGLYCERIN (NITROSTAT) 0.4 MG SL tablet Patient Preference    Follow-up: Return in about 6 months (around 07/29/2018).   Sherlene Shams, MD

## 2018-01-30 ENCOUNTER — Other Ambulatory Visit: Payer: Self-pay | Admitting: Internal Medicine

## 2018-01-30 DIAGNOSIS — E538 Deficiency of other specified B group vitamins: Secondary | ICD-10-CM

## 2018-01-30 MED ORDER — ERGOCALCIFEROL 1.25 MG (50000 UT) PO CAPS: 50000.0000 [IU] | ORAL_CAPSULE | ORAL | 2 refills | Status: DC

## 2018-01-30 NOTE — Assessment & Plan Note (Signed)
b12 deficiency diagnosed. patient asked to return for B12 injections weekly x 3 then monthly

## 2018-01-31 ENCOUNTER — Encounter: Payer: Self-pay | Admitting: Internal Medicine

## 2018-01-31 DIAGNOSIS — R634 Abnormal weight loss: Secondary | ICD-10-CM | POA: Insufficient documentation

## 2018-01-31 NOTE — Assessment & Plan Note (Addendum)
Incidental finding on CT done tp follow up on LUL nodule seen on xray.  2 vessel CAD mentioned.    Prn ntg,  Resume Imdur, declines statin due to prior adverse effect on memory .  Follow up with [facility]

## 2018-01-31 NOTE — Assessment & Plan Note (Signed)
Recurrent,  With 2 vessel disease noted on prior noninvasive imaging, and non diagnostic myoview.  Will resume  low dose Imdur  .  Beta blocker considered but not prescribed given bradycardic episodes noted on 30 day monitor for arrhythmias.

## 2018-01-31 NOTE — Assessment & Plan Note (Signed)
I have reviewed her diet and recommended that she increase her protein and fat intake using nutritional supplements. Samples given of Ensure

## 2018-01-31 NOTE — Assessment & Plan Note (Signed)
Her B12 is low  Patient advised to come in for b12 injections weekly for 3 weeks, followed by monthly injections thereafter

## 2018-01-31 NOTE — Assessment & Plan Note (Signed)
Recommended DEXA and treatment if indicated.

## 2018-02-05 ENCOUNTER — Ambulatory Visit (INDEPENDENT_AMBULATORY_CARE_PROVIDER_SITE_OTHER): Payer: Medicare Other

## 2018-02-05 DIAGNOSIS — E538 Deficiency of other specified B group vitamins: Secondary | ICD-10-CM

## 2018-02-05 MED ORDER — CYANOCOBALAMIN 1000 MCG/ML IJ SOLN
1000.0000 ug | Freq: Once | INTRAMUSCULAR | Status: AC
  Administered 2018-02-05: 1000 ug via INTRAMUSCULAR

## 2018-02-05 NOTE — Progress Notes (Addendum)
Patient comes in for B 12 injection.  Injected left deltoid.  Patient tolerated injection well.   Reviewed.  Dr Scott 

## 2018-02-12 ENCOUNTER — Telehealth: Payer: Self-pay

## 2018-02-12 ENCOUNTER — Ambulatory Visit (INDEPENDENT_AMBULATORY_CARE_PROVIDER_SITE_OTHER): Payer: Medicare Other

## 2018-02-12 DIAGNOSIS — E538 Deficiency of other specified B group vitamins: Secondary | ICD-10-CM

## 2018-02-12 MED ORDER — CYANOCOBALAMIN 1000 MCG/ML IJ SOLN
1000.0000 ug | Freq: Once | INTRAMUSCULAR | Status: AC
  Administered 2018-02-12: 1000 ug via INTRAMUSCULAR

## 2018-02-12 NOTE — Progress Notes (Signed)
Pt was seen today for NV and was given her 2nd dose of B-12. Given in RD pt seem to tolerate well. Pt advised to schedule for her 3rd B-12 shot for next week.

## 2018-02-12 NOTE — Telephone Encounter (Signed)
It depnds on the strength of the tablets she purchased. .  The dose is 600 mg twice daily

## 2018-02-12 NOTE — Telephone Encounter (Signed)
Pt wanted to know how much Red Yeast rice she should be taking?  Pt stated that she bought OTC red yeast rice and is taking 1 tablet twice daily but the bottle stated to take 2 tablets twice daily.   Pt would like to know. Sent to PCP.

## 2018-02-13 NOTE — Telephone Encounter (Signed)
Per DPR left detailed message on home voicemail.

## 2018-02-19 ENCOUNTER — Ambulatory Visit (INDEPENDENT_AMBULATORY_CARE_PROVIDER_SITE_OTHER): Payer: Medicare Other

## 2018-02-19 DIAGNOSIS — E538 Deficiency of other specified B group vitamins: Secondary | ICD-10-CM | POA: Diagnosis not present

## 2018-02-19 MED ORDER — CYANOCOBALAMIN 1000 MCG/ML IJ SOLN
1000.0000 ug | Freq: Once | INTRAMUSCULAR | Status: AC
  Administered 2018-02-19: 1000 ug via INTRAMUSCULAR

## 2018-02-19 NOTE — Progress Notes (Signed)
Pt was here today for her B-12 shot given in LD pt tolerated well. Pt advised to continue B-12 shot once month pt wanted to know how long will she need to do this for?  Pt stated that she has one more dose left for the Vit D 50,000 units once she is done with this how much vit d OTC should she continue to take?  Sent to PCP to advise

## 2018-03-17 ENCOUNTER — Encounter: Payer: Self-pay | Admitting: Internal Medicine

## 2018-03-25 ENCOUNTER — Ambulatory Visit (INDEPENDENT_AMBULATORY_CARE_PROVIDER_SITE_OTHER): Payer: Medicare Other | Admitting: *Deleted

## 2018-03-25 DIAGNOSIS — E538 Deficiency of other specified B group vitamins: Secondary | ICD-10-CM | POA: Diagnosis not present

## 2018-03-25 MED ORDER — CYANOCOBALAMIN 1000 MCG/ML IJ SOLN
1000.0000 ug | Freq: Once | INTRAMUSCULAR | Status: AC
  Administered 2018-03-25: 1000 ug via INTRAMUSCULAR

## 2018-03-25 NOTE — Progress Notes (Signed)
Patient came in clinic for B12 in left deltoid. Patient tolerated well.

## 2018-04-28 ENCOUNTER — Ambulatory Visit (INDEPENDENT_AMBULATORY_CARE_PROVIDER_SITE_OTHER): Payer: Medicare Other

## 2018-04-28 DIAGNOSIS — E538 Deficiency of other specified B group vitamins: Secondary | ICD-10-CM | POA: Diagnosis not present

## 2018-04-28 MED ORDER — CYANOCOBALAMIN 1000 MCG/ML IJ SOLN
1000.0000 ug | Freq: Once | INTRAMUSCULAR | Status: AC
  Administered 2018-04-28: 1000 ug via INTRAMUSCULAR

## 2018-04-28 NOTE — Progress Notes (Signed)
Pt was seen today for a B-12 shot given IM in the Rd pt tolerated well.

## 2018-07-29 ENCOUNTER — Ambulatory Visit: Payer: Medicare Other | Admitting: Internal Medicine

## 2018-07-30 ENCOUNTER — Other Ambulatory Visit: Payer: Self-pay | Admitting: Internal Medicine

## 2018-09-08 ENCOUNTER — Other Ambulatory Visit: Payer: Self-pay

## 2018-09-08 ENCOUNTER — Ambulatory Visit (INDEPENDENT_AMBULATORY_CARE_PROVIDER_SITE_OTHER): Payer: Medicare Other | Admitting: Internal Medicine

## 2018-09-08 DIAGNOSIS — E785 Hyperlipidemia, unspecified: Secondary | ICD-10-CM | POA: Diagnosis not present

## 2018-09-08 DIAGNOSIS — R55 Syncope and collapse: Secondary | ICD-10-CM

## 2018-09-08 DIAGNOSIS — Z7189 Other specified counseling: Secondary | ICD-10-CM

## 2018-09-08 DIAGNOSIS — J301 Allergic rhinitis due to pollen: Secondary | ICD-10-CM

## 2018-09-08 DIAGNOSIS — I2510 Atherosclerotic heart disease of native coronary artery without angina pectoris: Secondary | ICD-10-CM

## 2018-09-08 DIAGNOSIS — E538 Deficiency of other specified B group vitamins: Secondary | ICD-10-CM | POA: Diagnosis not present

## 2018-09-08 DIAGNOSIS — R634 Abnormal weight loss: Secondary | ICD-10-CM

## 2018-09-08 MED ORDER — TRIAMCINOLONE ACETONIDE 55 MCG/ACT NA AERO: 2.0000 | INHALATION_SPRAY | Freq: Every day | NASAL | 12 refills | Status: DC

## 2018-09-08 MED ORDER — ASPIRIN 81 MG PO CHEW: 81.0000 mg | CHEWABLE_TABLET | Freq: Every day | ORAL | 1 refills | Status: AC

## 2018-09-08 MED ORDER — ISOSORBIDE MONONITRATE ER 30 MG PO TB24: 30.0000 mg | ORAL_TABLET | Freq: Every day | ORAL | 1 refills | Status: DC

## 2018-09-08 MED ORDER — TRIAMCINOLONE ACETONIDE 55 MCG/ACT NA AERO: 2.0000 | INHALATION_SPRAY | Freq: Every day | NASAL | 3 refills | Status: DC

## 2018-09-08 NOTE — Progress Notes (Signed)
Telephone Note  This visit type was conducted due to national recommendations for restrictions regarding the COVID-19 pandemic (e.g. social distancing).  This format is felt to be most appropriate for this patient at this time.  All issues noted in this document were discussed and addressed.  No physical exam was performed (except for noted visual exam findings with Video Visits).   I connected with@ on 09/09/18 at 10:30 AM EDT by telephone and verified that I am speaking with the correct person using two identifiers. Location patient: home Location provider: work  Persons participating in the virtual visit: patient, provider  I discussed the limitations, risks, security and privacy concerns of performing an evaluation and management service by telephone and the availability of in person appointments. I also discussed with the patient that there may be a patient responsible charge related to this service. The patient expressed understanding and agreed to proceed.  Reason for visit: 6 month follow up on CAD managed with imdur and red yeast rice , b12 deficiency managed with injections monthly   HPI:  The patient has no signs or symptoms of COVID 19 infection (fever, cough, sore throat  or shortness of breath beyond what is typical for patient).  Patient denies contact with other persons with the above mentioned symptoms or with anyone confirmed to have COVID 19 .  [facility]   CAD:  hosp admission 3 2019,  Underwent a Normal stress test . Has had a few episodes of chest pain SINCE then occurring at variable times , each  lasting less than 15 minutes ,  Not lasting long enough to take a nitrate. Several times occurring at rest.  Takes imudur in the morning because it's the only time she remembers.   Watching her cholesterol in diet and taking  1 RYR . Daily,   Did not tolerate twice daily dosing due to excess gas. And did not tolerate   lipitor   Has  not had a b12 injection in several months   Weight has stabilized .  Appetite is good.   Using Nasacort once daiy for seasonal allergies.   ROS: See pertinent positives and negatives per HPI.  Past Medical History:  Diagnosis Date  . Cataracts, bilateral   . Chest pain    a. H/o neg stress test ~ 1995.  Marland Kitchen. Coronary atherosclerosis    a. 04/2017 CTA chest: coronary atherosclerosis noted ->pt refused statin.  . Degenerative joint disease   . Gastritis    followed by Dr. Bluford Kaufmannh  . History of tobacco abuse   . Macular degeneration    a. legally blind.  . Pre-syncope   . Squamous cell carcinoma    face, s/p excision    Past Surgical History:  Procedure Laterality Date  . APPENDECTOMY  1957  . BREAST BIOPSY     x 3, all normal  . CHOLECYSTECTOMY  1989  . EXPLORATORY LAPAROTOMY  1963  . OOPHORECTOMY  1957    Family History  Problem Relation Age of Onset  . Cancer Neg Hx   . Heart disease Neg Hx     SOCIAL HX: widowed  Lives alone,  IADL   Current Outpatient Medications:  .  acetaminophen (TYLENOL) 500 MG tablet, Take 500 mg by mouth every 6 (six) hours as needed for mild pain. , Disp: , Rfl:  .  aspirin 81 MG chewable tablet, Chew 1 tablet (81 mg total) by mouth daily., Disp:  90 tablet, Rfl: 1 .  calcium carbonate (CALCIUM 600) 600 MG TABS tablet, Take 600 mg by mouth 2 (two) times daily with a meal., Disp: , Rfl:  .  carboxymethylcellul-glycerin (OPTIVE) 0.5-0.9 % ophthalmic solution, Place 1 drop into both eyes daily as needed for dry eyes., Disp: , Rfl:  .  isosorbide mononitrate (IMDUR) 30 MG 24 hr tablet, Take 1 tablet (30 mg total) by mouth daily., Disp: 90 tablet, Rfl: 1 .  Multiple Vitamins-Minerals (VISION-VITE PRESERVE PO), Take one by mouth twice a day. , Disp: , Rfl:  .  Red Yeast Rice 600 MG CAPS, Take 600 capsules by mouth daily., Disp: , Rfl:  .  triamcinolone (NASACORT) 55 MCG/ACT AERO nasal inhaler, Place 2 sprays into the nose daily for 90  doses., Disp: 16.5 mL, Rfl: 3  EXAM:  VITALS per patient if applicable:  GENERAL: alert, oriented, appears well and in no acute distress  HEENT: atraumatic, conjunttiva clear, no obvious abnormalities on inspection of external nose and ears  NECK: normal movements of the head and neck  LUNGS: on inspection no signs of respiratory distress, breathing rate appears normal, no obvious gross SOB, gasping or wheezing  CV: no obvious cyanosis  MS: moves all visible extremities without noticeable abnormality  PSYCH/NEURO: pleasant and cooperative, no obvious depression or anxiety, speech and thought processing grossly intact  ASSESSMENT AND PLAN:  Discussed the following assessment and plan:  Hyperlipidemia LDL goal <100 - Plan: Comprehensive metabolic panel, Lipid panel  Seasonal allergic rhinitis due to pollen  Atherosclerosis of native coronary artery of native heart without angina pectoris  B12 deficiency  Near syncope  Weight loss, unintentional  Educated About Covid-19 Virus Infection  Allergic rhinitis Managed with Nasacort.  She denies congestion,  Epistaxis and  rhinorrhea  Refills given  Coronary atherosclerosis Incidental finding on CT done tp follow up on LUL nodule seen on xray.  2 vessel CAD mentioned.    Prn ntg,  Resume Imdur, declines statin due to prior adverse effect on memory .  Follow up  Semi annually with [facility]   B12 deficiency She has had a lapse in therapy. . patient asked to return for B12 injections monthly   Near syncope . She has had no  repeat occurrences.   Weight loss, unintentional Her weight has stabilized   Educated About Covid-19 Virus Infection Educated patient on the signs and symptoms of COVID-19 infection and ways to avoid the viral infection including washing hands frequently with soap and water,  using hand sanitizer if unable to wash, avoiding touching face,  staying at home and limiting visitors,  and avoiding contact with  people coming in and out of home.  Reminded patient to call office with questions/concerns.  The importance of social distancing was discussed today      I discussed the assessment and treatment plan with the patient. The patient was provided an opportunity to ask questions and all were answered. The patient agreed with the plan and demonstrated an understanding of the instructions.   The patient was advised to call back or seek an in-person evaluation if the symptoms worsen or if the condition fails to improve as anticipated.  I provided 20 minutes of non-face-to-face time during this encounter.   Sherlene Shams, MD

## 2018-09-09 DIAGNOSIS — Z7189 Other specified counseling: Secondary | ICD-10-CM | POA: Insufficient documentation

## 2018-09-09 NOTE — Assessment & Plan Note (Signed)
Her weight has stabilized

## 2018-09-09 NOTE — Assessment & Plan Note (Signed)
Incidental finding on CT done tp follow up on LUL nodule seen on xray.  2 vessel CAD mentioned.    Prn ntg,  Resume Imdur, declines statin due to prior adverse effect on memory .  Follow up  Semi annually with [facility]  

## 2018-09-09 NOTE — Assessment & Plan Note (Addendum)
She has had a lapse in therapy. . patient asked to return for B12 injections monthly

## 2018-09-09 NOTE — Assessment & Plan Note (Signed)
Managed with Nasacort.  She denies congestion,  Epistaxis and  rhinorrhea  Refills given

## 2018-09-09 NOTE — Assessment & Plan Note (Addendum)
Educated patient on the signs and symptoms of COVID-19 infection and ways to avoid the viral infection including washing hands frequently with soap and water,  using hand sanitizer if unable to wash, avoiding touching face,  staying at home and limiting visitors,  and avoiding contact with people coming in and out of home.  Reminded patient to call office with questions/concerns.  The importance of social distancing was discussed today 

## 2018-09-09 NOTE — Assessment & Plan Note (Signed)
.   She has had no  repeat occurrences.

## 2018-10-02 ENCOUNTER — Telehealth: Payer: Self-pay

## 2018-10-02 NOTE — Progress Notes (Signed)
LMTCB. Need to schedule pt for a lab appt and b12 injection.

## 2018-10-02 NOTE — Telephone Encounter (Signed)
Copied from CRM [id number]. Topic: General - Other >> Oct 02, 2018  2:05 PM Gwenlyn Fudge A wrote: Reason for CRM: Pt called stating she was returning a call to [place] and is requesting a call back. Please advise.

## 2018-10-07 NOTE — Telephone Encounter (Signed)
Pt has been scheduled and is aware of appt date and time.  

## 2018-10-14 ENCOUNTER — Other Ambulatory Visit (INDEPENDENT_AMBULATORY_CARE_PROVIDER_SITE_OTHER): Payer: Medicare Other

## 2018-10-14 ENCOUNTER — Other Ambulatory Visit: Payer: Self-pay

## 2018-10-14 ENCOUNTER — Ambulatory Visit (INDEPENDENT_AMBULATORY_CARE_PROVIDER_SITE_OTHER): Payer: Medicare Other | Admitting: *Deleted

## 2018-10-14 DIAGNOSIS — E538 Deficiency of other specified B group vitamins: Secondary | ICD-10-CM

## 2018-10-14 DIAGNOSIS — E785 Hyperlipidemia, unspecified: Secondary | ICD-10-CM | POA: Diagnosis not present

## 2018-10-14 LAB — LIPID PANEL
Cholesterol: 152 mg/dL (ref 0–200)
HDL: 50.5 mg/dL (ref >39.00)
LDL Cholesterol: 84 mg/dL (ref 0–99)
NonHDL: 101.76
Total CHOL/HDL Ratio: 3
Triglycerides: 91 mg/dL (ref 0.0–149.0)
VLDL: 18.2 mg/dL (ref 0.0–40.0)

## 2018-10-14 LAB — COMPREHENSIVE METABOLIC PANEL
ALT: 10 U/L (ref 0–35)
AST: 14 U/L (ref 0–37)
Albumin: 4 g/dL (ref 3.5–5.2)
Alkaline Phosphatase: 80 U/L (ref 39–117)
BUN: 16 mg/dL (ref 6–23)
CO2: 32 mEq/L (ref 19–32)
Calcium: 9.4 mg/dL (ref 8.4–10.5)
Chloride: 101 mEq/L (ref 96–112)
Creatinine, Ser: 0.55 mg/dL (ref 0.40–1.20)
GFR: 103.93 mL/min (ref >60.00)
Glucose, Bld: 87 mg/dL (ref 70–99)
Potassium: 4.2 mEq/L (ref 3.5–5.1)
Sodium: 138 mEq/L (ref 135–145)
Total Bilirubin: 0.7 mg/dL (ref 0.2–1.2)
Total Protein: 7 g/dL (ref 6.0–8.3)

## 2018-10-14 MED ORDER — CYANOCOBALAMIN 1000 MCG/ML IJ SOLN
1000.0000 ug | Freq: Once | INTRAMUSCULAR | Status: AC
  Administered 2018-10-14: 1000 ug via INTRAMUSCULAR

## 2018-10-14 NOTE — Progress Notes (Signed)
Patient presented for B 12 injection to right deltoid, patient voiced no concerns nor showed any signs of distress during injection. 

## 2018-10-17 LAB — INTRINSIC FACTOR ANTIBODIES: Intrinsic Factor: POSITIVE — AB

## 2018-11-17 ENCOUNTER — Ambulatory Visit: Payer: Medicare Other

## 2018-11-17 ENCOUNTER — Telehealth: Payer: Self-pay

## 2018-11-17 NOTE — Telephone Encounter (Signed)
Appt has been cancelled.  

## 2018-11-17 NOTE — Telephone Encounter (Signed)
Copied from Ali Molina [id number]. Topic: Quick Communication - Appointment Cancellation >> Nov 17, 2018  8:15 AM Scherrie Gerlach wrote: Patient called to cancel nurse visit appointment for today.(B12 inj) her ride is not in town/no transportation/will call back when ride comes back in town Appt has been cancelled.

## 2019-01-28 ENCOUNTER — Telehealth: Payer: Self-pay | Admitting: Internal Medicine

## 2019-01-28 NOTE — Telephone Encounter (Signed)
'  Debra Hubbard' at [facility] calling to review pt's history; states pt's not able to recall medications and cardiac history. Reviewed cardiac history with tech to her satisfaction.

## 2019-02-03 NOTE — Anesthesia Preprocedure Evaluation (Addendum)
Anesthesia Evaluation  Patient identified by MRN, date of birth, ID band Patient awake    Reviewed: Allergy & Precautions, NPO status , Patient's Chart, lab work & pertinent test results  History of Anesthesia Complications Negative for: history of anesthetic complications  Airway Mallampati: III   Neck ROM: Full    Dental  (+)    Pulmonary former smoker (quit 1986),    Pulmonary exam normal breath sounds clear to auscultation       Cardiovascular Exercise Tolerance: Good + CAD  Normal cardiovascular exam Rhythm:Regular Rate:Normal     Neuro/Psych HOH    GI/Hepatic negative GI ROS,   Endo/Other  negative endocrine ROS  Renal/GU negative Renal ROS     Musculoskeletal  (+) Arthritis , Rheumatoid disorders,    Abdominal   Peds  Hematology negative hematology ROS (+)   Anesthesia Other Findings   Reproductive/Obstetrics                            Anesthesia Physical Anesthesia Plan  ASA: II  Anesthesia Plan: MAC   Post-op Pain Management:    Induction: Intravenous  PONV Risk Score and Plan: 2 and Midazolam and TIVA  Airway Management Planned: Natural Airway  Additional Equipment:   Intra-op Plan:   Post-operative Plan:   Informed Consent: I have reviewed the patients History and Physical, chart, labs and discussed the procedure including the risks, benefits and alternatives for the proposed anesthesia with the patient or authorized representative who has indicated his/her understanding and acceptance.       Plan Discussed with: CRNA  Anesthesia Plan Comments:        Anesthesia Quick Evaluation

## 2019-02-05 ENCOUNTER — Other Ambulatory Visit
Admission: RE | Admit: 2019-02-05 | Discharge: 2019-02-05 | Disposition: A | Discharge status: Home / Self Care | Payer: Medicare Other | Source: Ambulatory Visit | Attending: Ophthalmology | Admitting: Ophthalmology

## 2019-02-05 DIAGNOSIS — Z01812 Encounter for preprocedural laboratory examination: Secondary | ICD-10-CM | POA: Diagnosis not present

## 2019-02-05 DIAGNOSIS — Z20828 Contact with and (suspected) exposure to other viral communicable diseases: Secondary | ICD-10-CM | POA: Insufficient documentation

## 2019-02-05 NOTE — Discharge Instructions (Signed)

## 2019-02-06 LAB — SARS CORONAVIRUS 2 (TAT 6-24 HRS): SARS Coronavirus 2: NEGATIVE

## 2019-02-09 ENCOUNTER — Ambulatory Visit: Payer: Medicare Other | Admitting: Anesthesiology

## 2019-02-09 ENCOUNTER — Ambulatory Visit
Admission: RE | Admit: 2019-02-09 | Discharge: 2019-02-09 | Disposition: A | Discharge status: Home / Self Care | Payer: Medicare Other | Attending: Ophthalmology | Admitting: Ophthalmology

## 2019-02-09 ENCOUNTER — Encounter
Admission: RE | Disposition: A | Discharge status: Home / Self Care | Payer: Self-pay | Source: Home / Self Care | Attending: Ophthalmology

## 2019-02-09 DIAGNOSIS — H2512 Age-related nuclear cataract, left eye: Secondary | ICD-10-CM | POA: Insufficient documentation

## 2019-02-09 DIAGNOSIS — Z7982 Long term (current) use of aspirin: Secondary | ICD-10-CM | POA: Diagnosis not present

## 2019-02-09 DIAGNOSIS — I25119 Atherosclerotic heart disease of native coronary artery with unspecified angina pectoris: Secondary | ICD-10-CM | POA: Insufficient documentation

## 2019-02-09 DIAGNOSIS — M069 Rheumatoid arthritis, unspecified: Secondary | ICD-10-CM | POA: Insufficient documentation

## 2019-02-09 DIAGNOSIS — Z87891 Personal history of nicotine dependence: Secondary | ICD-10-CM | POA: Insufficient documentation

## 2019-02-09 DIAGNOSIS — Z79899 Other long term (current) drug therapy: Secondary | ICD-10-CM | POA: Diagnosis not present

## 2019-02-09 DIAGNOSIS — I7090 Unspecified atherosclerosis: Secondary | ICD-10-CM | POA: Insufficient documentation

## 2019-02-09 DIAGNOSIS — E7800 Pure hypercholesterolemia, unspecified: Secondary | ICD-10-CM | POA: Insufficient documentation

## 2019-02-09 HISTORY — PX: CATARACT EXTRACTION W/PHACO: SHX586

## 2019-02-09 SURGERY — PHACOEMULSIFICATION, CATARACT, WITH IOL INSERTION
Anesthesia: Monitor Anesthesia Care | Site: Eye | Laterality: Left

## 2019-02-09 MED ORDER — ARMC OPHTHALMIC DILATING DROPS
1.0000 "application " | OPHTHALMIC | Status: DC | PRN
  Administered 2019-02-09 (×3): 1 via OPHTHALMIC

## 2019-02-09 MED ORDER — ACETAMINOPHEN 325 MG PO TABS: 650.0000 mg | ORAL_TABLET | Freq: Once | ORAL | Status: DC | PRN

## 2019-02-09 MED ORDER — LIDOCAINE HCL (PF) 2 % IJ SOLN
INTRAOCULAR | Status: DC | PRN
  Administered 2019-02-09: 2 mL

## 2019-02-09 MED ORDER — NA CHONDROIT SULF-NA HYALURON 40-17 MG/ML IO SOLN
INTRAOCULAR | Status: DC | PRN
  Administered 2019-02-09: 1 mL via INTRAOCULAR

## 2019-02-09 MED ORDER — EPINEPHRINE PF 1 MG/ML IJ SOLN
INTRAOCULAR | Status: DC | PRN
  Administered 2019-02-09: 59 mL via OPHTHALMIC

## 2019-02-09 MED ORDER — BRIMONIDINE TARTRATE-TIMOLOL 0.2-0.5 % OP SOLN
OPHTHALMIC | Status: DC | PRN
  Administered 2019-02-09: 1 [drp] via OPHTHALMIC

## 2019-02-09 MED ORDER — MOXIFLOXACIN HCL 0.5 % OP SOLN
OPHTHALMIC | Status: DC | PRN
  Administered 2019-02-09: 0.2 mL via OPHTHALMIC

## 2019-02-09 MED ORDER — TETRACAINE HCL 0.5 % OP SOLN
1.0000 [drp] | OPHTHALMIC | Status: DC | PRN
  Administered 2019-02-09 (×3): 1 [drp] via OPHTHALMIC

## 2019-02-09 MED ORDER — ONDANSETRON HCL 4 MG/2ML IJ SOLN: 4.0000 mg | Freq: Once | INTRAMUSCULAR | Status: DC | PRN

## 2019-02-09 MED ORDER — FENTANYL CITRATE (PF) 100 MCG/2ML IJ SOLN
INTRAMUSCULAR | Status: DC | PRN
  Administered 2019-02-09: 50 ug via INTRAVENOUS

## 2019-02-09 MED ORDER — ACETAMINOPHEN 160 MG/5ML PO SOLN: 325.0000 mg | ORAL | Status: DC | PRN

## 2019-02-09 SURGICAL SUPPLY — 18 items
CANNULA ANT/CHMB 27GA (MISCELLANEOUS) ×6 IMPLANT
GLOVE SURG LX 8.0 MICRO (GLOVE) ×2
GLOVE SURG LX STRL 8.0 MICRO (GLOVE) ×1 IMPLANT
GLOVE SURG TRIUMPH 8.0 PF LTX (GLOVE) ×3 IMPLANT
GOWN STRL REUS W/ TWL LRG LVL3 (GOWN DISPOSABLE) ×2 IMPLANT
GOWN STRL REUS W/TWL LRG LVL3 (GOWN DISPOSABLE) ×4
LENS IOL TECNIS ITEC 19.5 (Intraocular Lens) ×3 IMPLANT
MARKER SKIN DUAL TIP RULER LAB (MISCELLANEOUS) ×3 IMPLANT
NDL RETROBULBAR .5 NSTRL (NEEDLE) ×3 IMPLANT
NEEDLE FILTER BLUNT 18X 1/2SAF (NEEDLE) ×2
NEEDLE FILTER BLUNT 18X1 1/2 (NEEDLE) ×1 IMPLANT
PACK EYE AFTER SURG (MISCELLANEOUS) ×3 IMPLANT
PACK OPTHALMIC (MISCELLANEOUS) ×3 IMPLANT
PACK PORFILIO (MISCELLANEOUS) ×3 IMPLANT
SYR 3ML LL SCALE MARK (SYRINGE) ×3 IMPLANT
SYR TB 1ML LUER SLIP (SYRINGE) ×3 IMPLANT
WATER STERILE IRR 250ML POUR (IV SOLUTION) ×3 IMPLANT
WIPE NON LINTING 3.25X3.25 (MISCELLANEOUS) ×3 IMPLANT

## 2019-02-09 NOTE — Anesthesia Procedure Notes (Signed)
Procedure Name: MAC Date/Time: 02/09/2019 8:05 AM Performed by: Cameron Ali, CRNA Pre-anesthesia Checklist: Patient identified, Emergency Drugs available, Suction available, Timeout performed and Patient being monitored Patient Re-evaluated:Patient Re-evaluated prior to induction Oxygen Delivery Method: Nasal cannula Placement Confirmation: positive ETCO2

## 2019-02-09 NOTE — Op Note (Signed)
PREOPERATIVE DIAGNOSIS:  Nuclear sclerotic cataract of the left eye.   POSTOPERATIVE DIAGNOSIS:  Nuclear sclerotic cataract of the left eye.   OPERATIVE PROCEDURE:@   SURGEON:  Birder Robson, MD.   ANESTHESIA:  Anesthesiologist: Darrin Nipper, MD CRNA: Cameron Ali, CRNA  1.      Managed anesthesia care. 2.     0.36ml of Shugarcaine was instilled following the paracentesis   COMPLICATIONS:  None.   TECHNIQUE:   Stop and chop   DESCRIPTION OF PROCEDURE:  The patient was examined and consented in the preoperative holding area where the aforementioned topical anesthesia was applied to the left eye and then brought back to the Operating Room where the left eye was prepped and draped in the usual sterile ophthalmic fashion and a lid speculum was placed. A paracentesis was created with the side port blade and the anterior chamber was filled with viscoelastic. A near clear corneal incision was performed with the steel keratome. A continuous curvilinear capsulorrhexis was performed with a cystotome followed by the capsulorrhexis forceps. Hydrodissection and hydrodelineation were carried out with BSS on a blunt cannula. The lens was removed in a stop and chop  technique and the remaining cortical material was removed with the irrigation-aspiration handpiece. The capsular bag was inflated with viscoelastic and the Technis [id number] lens was placed in the capsular bag without complication. The remaining viscoelastic was removed from the eye with the irrigation-aspiration handpiece. The wounds were hydrated. The anterior chamber was flushed with BSS and the eye was inflated to physiologic pressure. 0.81ml Vigamox was placed in the anterior chamber. The wounds were found to be water tight. The eye was dressed with Combigan. The patient was given protective glasses to wear throughout the day and a shield with which to sleep tonight. The patient was also given drops with which to begin a drop regimen today and  will follow-up with me in one day. Implant Name Type Inv. Item Serial No. Manufacturer Lot No. LRB No. Used Action  LENS IOL DIOP 19.5 - [id number] Intraocular Lens LENS IOL DIOP 19.5 [id number]  Left 1 Implanted    Procedure(s): CATARACT EXTRACTION PHACO AND INTRAOCULAR LENS PLACEMENT (IOC) LEFT 01:12.0        19.0%       13.72 (Left)  Electronically signed: Birder Robson 02/09/2019 8:28 AM

## 2019-02-09 NOTE — Anesthesia Postprocedure Evaluation (Signed)
Anesthesia Post Note  Patient: Debra Hubbard  Procedure(s) Performed: CATARACT EXTRACTION PHACO AND INTRAOCULAR LENS PLACEMENT (IOC) LEFT 01:12.0        19.0%       13.72 (Left Eye)  Patient location during evaluation: PACU Anesthesia Type: MAC Level of consciousness: awake and alert, oriented and patient cooperative Pain management: pain level controlled Vital Signs Assessment: post-procedure vital signs reviewed and stable Respiratory status: spontaneous breathing, nonlabored ventilation and respiratory function stable Cardiovascular status: blood pressure returned to baseline and stable Postop Assessment: adequate PO intake Anesthetic complications: no    Darrin Nipper

## 2019-02-09 NOTE — Transfer of Care (Signed)
Immediate Anesthesia Transfer of Care Note  Patient: Debra Hubbard  Procedure(s) Performed: CATARACT EXTRACTION PHACO AND INTRAOCULAR LENS PLACEMENT (IOC) LEFT 01:12.0        19.0%       13.72 (Left Eye)  Patient Location: PACU  Anesthesia Type: MAC  Level of Consciousness: awake, alert  and patient cooperative  Airway and Oxygen Therapy: Patient Spontanous Breathing and Patient connected to supplemental oxygen  Post-op Assessment: Post-op Vital signs reviewed, Patient's Cardiovascular Status Stable, Respiratory Function Stable, Patent Airway and No signs of Nausea or vomiting  Post-op Vital Signs: Reviewed and stable  Complications: No apparent anesthesia complications

## 2019-02-09 NOTE — H&P (Signed)
All labs reviewed. Abnormal studies sent to patients PCP when indicated.  Previous H&P reviewed, patient examined, there are NO CHANGES.  Debra Secrist Porfilio9/29/20207:55 AM

## 2019-03-30 ENCOUNTER — Other Ambulatory Visit: Payer: Self-pay | Admitting: Internal Medicine

## 2019-03-30 NOTE — Telephone Encounter (Signed)
Refilled: 07/16/2017 Last OV: 09/08/2018 Next OV: 04/28/2019

## 2019-04-22 ENCOUNTER — Other Ambulatory Visit: Payer: Self-pay

## 2019-04-27 ENCOUNTER — Ambulatory Visit (INDEPENDENT_AMBULATORY_CARE_PROVIDER_SITE_OTHER): Payer: Medicare Other

## 2019-04-27 ENCOUNTER — Other Ambulatory Visit: Payer: Self-pay

## 2019-04-27 DIAGNOSIS — Z23 Encounter for immunization: Secondary | ICD-10-CM | POA: Diagnosis not present

## 2019-04-27 NOTE — Progress Notes (Signed)
Patient came in today for high-dose flu vaccine in right deltoid. Patient tolerated well.

## 2019-04-28 ENCOUNTER — Ambulatory Visit (INDEPENDENT_AMBULATORY_CARE_PROVIDER_SITE_OTHER): Payer: Medicare Other | Admitting: Internal Medicine

## 2019-04-28 ENCOUNTER — Encounter: Payer: Self-pay | Admitting: Internal Medicine

## 2019-04-28 DIAGNOSIS — I2510 Atherosclerotic heart disease of native coronary artery without angina pectoris: Secondary | ICD-10-CM | POA: Diagnosis not present

## 2019-04-28 DIAGNOSIS — H3530 Unspecified macular degeneration: Secondary | ICD-10-CM | POA: Diagnosis not present

## 2019-04-28 DIAGNOSIS — Z7189 Other specified counseling: Secondary | ICD-10-CM

## 2019-04-28 DIAGNOSIS — H60502 Unspecified acute noninfective otitis externa, left ear: Secondary | ICD-10-CM | POA: Diagnosis not present

## 2019-04-28 NOTE — Progress Notes (Signed)
Telephone  Note  This visit type was conducted due to national recommendations for restrictions regarding the COVID-19 pandemic (e.g. social distancing).  This format is felt to be most appropriate for this patient at this time.  All issues noted in this document were discussed and addressed.  No physical exam was performed (except for noted visual exam findings with Video Visits).   I connected with@ on 04/28/19 at  1:30 PM EST by  telephone and verified that I am speaking with the correct person using two identifiers. Location patient: home Location provider: work or home office Persons participating in the virtual visit: patient, provider  I discussed the limitations, risks, security and privacy concerns of performing an evaluation and management service by telephone and the availability of in person appointments. I also discussed with the patient that there may be a patient responsible charge related to this service. The patient expressed understanding and agreed to proceed.  Reason for visit: 6 month follow up  HPI: 85 yr old female with hyperlipidemia,  Recurrent chest pain , pernicious anemia presets with left sided ear pain   The patient has no signs or symptoms of COVID 19 infection (fever, cough, sore throat  or shortness of breath beyond what is typical for patient).  Patient denies contact with other persons with the above mentioned symptoms or with anyone confirmed to have COVID 19   Has been cooking,  Reading,  And does not feel  At all confined by the quarantine.  Appetite is good,  Weight has been stable  Occasional dizzy spells when she bends over from the waist .  Minor,  No falls.  Lives alone and is unsupervised most of the time   Cc:  Vision and hearing impairment. Had cataract surgery on left eye  in September.  Has macular degeneration   "I'm getting forgetful." Came to the office for Flu vaccine yesterday, couldn't remember what she was there  Treated for "ear  infection"   On Monday by Dr Shon Baton at [facility].   Symptoms were pain lasting 5-6 days . Wears a hearing aid in the left The topical ointment prescribed was too expensive to fill ("$70")t    ROS: See pertinent positives and negatives per HPI.  Past Medical History:  Diagnosis Date  . Cataracts, bilateral   . Chest pain    a. H/o neg stress test ~ 1995.  Marland Kitchen Coronary atherosclerosis    a. 04/2017 CTA chest: coronary atherosclerosis noted ->pt refused statin.  . Degenerative joint disease    rheumatoid arthritis  . Gastritis    followed by Dr. Bluford Kaufmann  . History of tobacco abuse   . Macular degeneration    a. legally blind.  . Pre-syncope   . Squamous cell carcinoma    face, s/p excision    Past Surgical History:  Procedure Laterality Date  . APPENDECTOMY  1957  . BREAST BIOPSY     x 3, all normal  . CATARACT EXTRACTION W/PHACO Left 02/09/2019   Procedure: CATARACT EXTRACTION PHACO AND INTRAOCULAR LENS PLACEMENT (IOC) LEFT 01:12.0        19.0%       13.72;  Surgeon: Galen Manila, MD;  Location: [facility];  Service: Ophthalmology;  Laterality: Left;  . CHOLECYSTECTOMY  1989  . EXPLORATORY LAPAROTOMY  1963  . OOPHORECTOMY  1957    Family History  Problem Relation Age of Onset  . Cancer Neg Hx   . Heart disease Neg Hx  SOCIAL HX: lives alone.  reports that she quit smoking about 34 years ago. She has never used smokeless tobacco. She reports that she does not drink alcohol or use drugs.  Current Outpatient Medications:  .  acetaminophen (TYLENOL) 500 MG tablet, Take 500 mg by mouth every 6 (six) hours as needed for mild pain. , Disp: , Rfl:  .  aspirin 81 MG chewable tablet, Chew 1 tablet (81 mg total) by mouth daily., Disp: 90 tablet, Rfl: 1 .  calcium carbonate (CALCIUM 600) 600 MG TABS tablet, Take 600 mg by mouth 2 (two) times daily with a meal., Disp: , Rfl:  .  carboxymethylcellul-glycerin (OPTIVE) 0.5-0.9 % ophthalmic solution, Place 1 drop into both  eyes daily as needed for dry eyes., Disp: , Rfl:  .  fluticasone (FLONASE) 50 MCG/ACT nasal spray, Place 1 spray into both nostrils daily., Disp: , Rfl:  .  isosorbide mononitrate (IMDUR) 30 MG 24 hr tablet, Take 1 tablet (30 mg total) by mouth daily., Disp: 90 tablet, Rfl: 1 .  mometasone (ELOCON) 0.1 % cream, Apply 1 application topically as needed., Disp: , Rfl:  .  mometasone (ELOCON) 0.1 % ointment, APPLY TOPICALLY DAILY, Disp: 45 g, Rfl: 0 .  Multiple Vitamins-Minerals (VISION-VITE PRESERVE PO), Take one by mouth twice a day. , Disp: , Rfl:  .  nitroGLYCERIN (NITROSTAT) 0.4 MG SL tablet, Place 0.4 mg under the tongue every 5 (five) minutes as needed for chest pain., Disp: , Rfl:  .  Red Yeast Rice 600 MG CAPS, Take 600 capsules by mouth daily., Disp: , Rfl:  .  triamcinolone (NASACORT) 55 MCG/ACT AERO nasal inhaler, Place 2 sprays into the nose daily for 90 doses. (Patient not taking: Reported on 02/02/2019), Disp: 16.5 mL, Rfl: 3  EXAM:   General impression: alert, cooperative and articulate.  No signs of being in distress  Lungs: speech is fluent sentence length suggests that patient is not short of breath and not punctuated by cough, sneezing or sniffing. Marland Kitchen   Psych: affect normal.  speech is articulate and non pressured .  Denies suicidal thoughts  Neuro: short term memory is intact with regard to current events,  Recall and calculation   ASSESSMENT AND PLAN:  Discussed the following assessment and plan:  Educated about COVID-19 virus infection  Acute otitis externa of left ear, unspecified type  Atherosclerosis of native coronary artery of native heart without angina pectoris  Macular degeneration, unspecified laterality, unspecified type  Educated About Covid-19 Virus Infection Educated patient on the newly broadened list of signs and symptoms of COVID-19 infection and ways to avoid the viral infection including washing hands frequently with soap and water,  using hand  sanitizer if unable to wash, avoiding touching face,  staying at home and limiting visitors,  and avoiding contact with people coming in and out of home.    Reminded patient to call office with questions/concerns.  The importance of continued social distancing was discussed today . Patient was screened for the development of any unsafe behaviors or habits that may have developed as a result of the social impact of the virus , including alcohol abuse,  Domestic violence, tobacco abuse and overeating.        Otitis externa Under treatment by ENT with a topical ointment that was too $$ for patient.  Advised to all ENT office for alternative.   Coronary atherosclerosis Incidental finding on CT done tp follow up on LUL nodule seen on xray.  2 vessel CAD  mentioned.    Prn ntg,  Resume Imdur, declines statin due to prior adverse effect on memory .  Follow up  Semi annually with [facility]   Macular degeneration Vision deficits may soon impact her ability  to remain  INDEPENDENT.     I discussed the assessment and treatment plan with the patient. The patient was provided an opportunity to ask questions and all were answered. The patient agreed with the plan and demonstrated an understanding of the instructions.   The patient was advised to call back or seek an in-person evaluation if the symptoms worsen or if the condition fails to improve as anticipated.  I provided  22 minutes of non-face-to-face time during this encounter reviewing patient's current problems and past procedures/imaging studies, providing counseling on the above mentioned problems , and coordination  of care . Sherlene Shams, MD

## 2019-04-29 DIAGNOSIS — H6090 Unspecified otitis externa, unspecified ear: Secondary | ICD-10-CM | POA: Insufficient documentation

## 2019-04-29 NOTE — Assessment & Plan Note (Signed)
Educated patient on the newly broadened list of signs and symptoms of COVID-19 infection and ways to avoid the viral infection including washing hands frequently with soap and water,  using hand sanitizer if unable to wash, avoiding touching face,  staying at home and limiting visitors,  and avoiding contact with people coming in and out of home.   Reminded patient to call office with questions/concerns.  The importance of continued social distancing was discussed today . Patient was screened for the development of any unsafe behaviors or habits that may have developed as a result of the social impact of the virus , including alcohol abuse,  Domestic violence, tobacco abuse and overeating.    

## 2019-04-29 NOTE — Assessment & Plan Note (Signed)
Under treatment by ENT with a topical ointment that was too $$ for patient.  Advised to all ENT office for alternative.

## 2019-04-29 NOTE — Assessment & Plan Note (Signed)
Vision deficits may soon impact her ability  to remain  INDEPENDENT.

## 2019-04-29 NOTE — Assessment & Plan Note (Signed)
Incidental finding on CT done tp follow up on LUL nodule seen on xray.  2 vessel CAD mentioned.    Prn ntg,  Resume Imdur, declines statin due to prior adverse effect on memory .  Follow up  Semi annually with [facility]

## 2019-06-29 ENCOUNTER — Other Ambulatory Visit: Payer: Self-pay | Admitting: Internal Medicine

## 2019-08-25 ENCOUNTER — Encounter (INDEPENDENT_AMBULATORY_CARE_PROVIDER_SITE_OTHER): Payer: Self-pay

## 2019-08-25 ENCOUNTER — Ambulatory Visit (INDEPENDENT_AMBULATORY_CARE_PROVIDER_SITE_OTHER): Payer: Medicare Other

## 2019-08-25 VITALS — Ht 63.0 in | Wt 109.0 lb

## 2019-08-25 DIAGNOSIS — Z0000 Encounter for general adult medical examination without abnormal findings: Secondary | ICD-10-CM | POA: Diagnosis not present

## 2019-08-25 NOTE — Patient Instructions (Addendum)
  Debra Hubbard , Thank you for taking time to come for your Medicare Wellness Visit. I appreciate your ongoing commitment to your health goals. Please review the following plan we discussed and let me know if I can assist you in the future.   These are the goals we discussed: Goals    . Follow up with Primary Care Provider     As needed       This is a list of the screening recommended for you and due dates:  Health Maintenance  Topic Date Due  . DEXA scan (bone density measurement)  08/24/2020*  . Tetanus Vaccine  08/24/2020*  . Flu Shot  12/12/2019  . Pneumonia vaccines  Completed  *Topic was postponed. The date shown is not the original due date.

## 2019-08-25 NOTE — Progress Notes (Addendum)
Subjective:   Debra Hubbard is a 86 y.o. female who presents for Medicare Annual (Subsequent) preventive examination.  Review of Systems:  No ROS.  Medicare Wellness Virtual Visit.  Visual/audio telehealth visit, UTA vital signs.   Ht/Wt provided.  See social history for additional risk factors.  Cardiac Risk Factors include: advanced age (>48men, >56 women)     Objective:     Vitals: Ht 5\' 3"  (1.6 m)   Wt 109 lb (49.4 kg)   BMI 19.31 kg/m   Body mass index is 19.31 kg/m.  Advanced Directives 08/25/2019 02/09/2019 07/20/2017 07/20/2017 07/09/2017 05/02/2017 10/04/2015  Does Patient Have a Medical Advance Directive? Yes Yes Yes Yes Yes No Yes  Type of Advance Directive Living will;Healthcare Power of 10/06/2015 Power of [place];Living will Living will;Healthcare Power of [place] Power of [place];Living will Healthcare Power of [place];Living will - -  Does patient want to make changes to medical advance directive? No - Patient declined No - Patient declined No - Patient declined - - - No - Patient declined  Copy of Healthcare Power of Attorney in Chart? No - copy requested No - copy requested No - copy requested - - - -    Tobacco Social History   Tobacco Use  Smoking Status Former Smoker  . Quit date: 08/20/1984  . Years since quitting: 35.0  Smokeless Tobacco Never Used     Counseling given: Not Answered   Clinical Intake:  Pre-visit preparation completed: Yes        Diabetes: No  How often do you need to have someone help you when you read instructions, pamphlets, or other written materials from your doctor or pharmacy?: 2 - Rarely  Interpreter Needed?: No     Past Medical History:  Diagnosis Date  . Cataracts, bilateral   . Chest pain    a. H/o neg stress test ~ 1995.  1996 Coronary atherosclerosis    a. 04/2017 CTA chest: coronary atherosclerosis noted ->pt refused statin.  . Degenerative joint disease    rheumatoid arthritis  .  Gastritis    followed by Dr. 05/2017  . History of tobacco abuse   . Macular degeneration    a. legally blind.  . Pre-syncope   . Squamous cell carcinoma    face, s/p excision   Past Surgical History:  Procedure Laterality Date  . APPENDECTOMY  1957  . BREAST BIOPSY     x 3, all normal  . CATARACT EXTRACTION W/PHACO Left 02/09/2019   Procedure: CATARACT EXTRACTION PHACO AND INTRAOCULAR LENS PLACEMENT (IOC) LEFT 01:12.0        19.0%       13.72;  Surgeon: 02/11/2019, MD;  Location: [facility];  Service: Ophthalmology;  Laterality: Left;  . CHOLECYSTECTOMY  1989  . EXPLORATORY LAPAROTOMY  1963  . OOPHORECTOMY  1957   Family History  Problem Relation Age of Onset  . Cancer Neg Hx   . Heart disease Neg Hx    Social History   Socioeconomic History  . Marital status: Widowed    Spouse name: Not on file  . Number of children: Not on file  . Years of education: Not on file  . Highest education level: Not on file  Occupational History  . Not on file  Tobacco Use  . Smoking status: Former Smoker    Quit date: 08/20/1984    Years since quitting: 35.0  . Smokeless tobacco: Never Used  Substance and Sexual Activity  .  Alcohol use: No  . Drug use: No  . Sexual activity: Not on file  Other Topics Concern  . Not on file  Social History Narrative   Lives in retirement facility.  Relatively active but does not routinely exercise.   Social Determinants of Health   Financial Resource Strain: Low Risk   . Difficulty of Paying Living Expenses: Not hard at all  Food Insecurity: No Food Insecurity  . Worried About Programme researcher, broadcasting/film/video in the Last Year: Never true  . Ran Out of Food in the Last Year: Never true  Transportation Needs: No Transportation Needs  . Lack of Transportation (Medical): No  . Lack of Transportation (Non-Medical): No  Physical Activity: Unknown  . Days of Exercise per Week: 0 days  . Minutes of Exercise per Session: Not on file  Stress: No Stress  Concern Present  . Feeling of Stress : Not at all  Social Connections:   . Frequency of Communication with Friends and Family:   . Frequency of Social Gatherings with Friends and Family:   . Attends Religious Services:   . Active Member of Clubs or Organizations:   . Attends Banker Meetings:   Marland Kitchen Marital Status:     Outpatient Encounter Medications as of 08/25/2019  Medication Sig  . acetaminophen (TYLENOL) 500 MG tablet Take 500 mg by mouth every 6 (six) hours as needed for mild pain.   Marland Kitchen aspirin 81 MG chewable tablet Chew 1 tablet (81 mg total) by mouth daily.  . calcium carbonate (CALCIUM 600) 600 MG TABS tablet Take 600 mg by mouth 2 (two) times daily with a meal.  . carboxymethylcellul-glycerin (OPTIVE) 0.5-0.9 % ophthalmic solution Place 1 drop into both eyes daily as needed for dry eyes.  . fluticasone (FLONASE) 50 MCG/ACT nasal spray Place 1 spray into both nostrils daily.  . isosorbide mononitrate (IMDUR) 30 MG 24 hr tablet TAKE 1 TABLET BY MOUTH DAILY  . mometasone (ELOCON) 0.1 % cream Apply 1 application topically as needed.  . mometasone (ELOCON) 0.1 % ointment APPLY TOPICALLY DAILY  . Multiple Vitamins-Minerals (VISION-VITE PRESERVE PO) Take one by mouth twice a day.   . nitroGLYCERIN (NITROSTAT) 0.4 MG SL tablet Place 0.4 mg under the tongue every 5 (five) minutes as needed for chest pain.  . Red Yeast Rice 600 MG CAPS Take 600 capsules by mouth daily.  Marland Kitchen triamcinolone (NASACORT) 55 MCG/ACT AERO nasal inhaler Place 2 sprays into the nose daily for 90 doses. (Patient not taking: Reported on 02/02/2019)   No facility-administered encounter medications on file as of 08/25/2019.    Activities of Daily Living In your present state of health, do you have any difficulty performing the following activities: 08/25/2019 02/09/2019  Hearing? Y N  Comment Hearing aids -  Vision? Y N  Comment Macular degeneration -  Difficulty concentrating or making decisions? N N    Walking or climbing stairs? N N  Comment Paces herself -  Dressing or bathing? N N  Doing errands, shopping? Y -  Quarry manager and eating ? N -  Using the Toilet? N -  In the past six months, have you accidently leaked urine? Y -  Comment Managed with a daily pad -  Do you have problems with loss of bowel control? N -  Managing your Medications? N -  Managing your Finances? N -  Housekeeping or managing your Housekeeping? Y -  Comment Maid assist -  Some recent data might be hidden  Patient Care Team: Crecencio Mc, MD as PCP - General (Internal Medicine) Minna Merritts, MD as PCP - Cardiology (Cardiology)    Assessment:   This is a routine wellness examination for [place].  Nurse connected with patient 08/25/19 at 10:30 AM EDT by a telephone enabled telemedicine application and verified that I am speaking with the correct person using two identifiers. Patient stated full name and DOB. Patient gave permission to continue with virtual visit. Patient's location was at home and Nurse's location was at [place] office.   Patient is alert and oriented x3. Patient denies difficulty focusing or concentrating.  Health Maintenance Due: -Tdap vaccine- deferred per patient request.  -Dexa Scan- deferred per patient request.  -Covid vaccines complete- patient plans to update. See completed HM at the end of note.   Eye: Visual acuity not assessed. Virtual visit. Followed by their ophthalmologist.  Wears glasses. Macular degeneration; injections every 4-6-8 weeks.  Dental: Visits every 6 months.    Hearing: Hearing aids- yes  Safety:  Patient feels safe at home- yes Patient does have smoke detectors at home- yes Patient does wear sunscreen or protective clothing when in direct sunlight - yes Patient does wear seat belt when in a moving vehicle - yes Patient drives- no Adequate lighting in walkways free from debris- yes Grab bars and handrails used as appropriate-  yes Ambulates with an assistive device- no Cell phone with person when outside of the home. Life alert system in place-yes  Social: Alcohol intake - no    Smoking history- former  Smokers in home? none Illicit drug use? none  Medication: Taking as directed and without issues.  Pill box in use -yes  Self managed - yes   Covid-19: Precautions and sickness symptoms discussed. Wears mask, social distancing, hand hygiene as appropriate.   Activities of Daily Living Patient denies needing assistance with: feeding themselves, getting from bed to chair, getting to the toilet, bathing/showering, dressing, managing money, or preparing meals.  Maid assist with household chores.  Discussed the importance of a healthy diet, water intake and the benefits of aerobic exercise.   Physical activity- active around the home, no routine.  Diet:  Regular Water: good intake  Caffeine: 2 cups of coffee  Other Providers Patient Care Team: Crecencio Mc, MD as PCP - General (Internal Medicine) Minna Merritts, MD as PCP - Cardiology (Cardiology)  Exercise Activities and Dietary recommendations Current Exercise Habits: The patient does not participate in regular exercise at present  Goals    . Follow up with Primary Care Provider     As needed       Fall Risk Fall Risk  08/25/2019 04/28/2019 01/28/2018 03/13/2016 10/04/2015  Falls in the past year? 0 0 No Yes Yes  Number falls in past yr: - - - 1 -  Injury with Fall? - - - No -  Follow up Falls evaluation completed Falls evaluation completed - - -   Timed Get Up and Go performed: no, virtual visit  Depression Screen PHQ 2/9 Scores 08/25/2019 01/28/2018 05/16/2017 03/13/2016  PHQ - 2 Score 0 0 4 0  PHQ- 9 Score - - 7 -     Cognitive Function     6CIT Screen 08/25/2019  What Year? 0 points  What month? 0 points  What time? 0 points  Count back from 20 0 points  Months in reverse 0 points  Repeat phrase 0 points  Total Score 0     Immunization History  Administered Date(s) Administered  . Fluad Quad(high Dose 65+) 04/27/2019  . Influenza Split 05/16/2011, 02/27/2012  . Influenza, High Dose Seasonal PF 03/13/2016, 01/28/2018  . Influenza,inj,Quad PF,6+ Mos 03/30/2013, 02/25/2014, 01/02/2015  . Influenza-Unspecified 02/07/2017  . Pneumococcal Conjugate-13 12/21/2013  . Pneumococcal-Unspecified 05/13/2004   Screening Tests Health Maintenance  Topic Date Due  . DEXA SCAN  08/24/2020 (Originally 03/11/1994)  . TETANUS/TDAP  08/24/2020 (Originally 03/11/1946)  . INFLUENZA VACCINE  12/12/2019  . PNA vac Low Risk Adult  Completed      Plan:   Keep all routine maintenance appointments.   Medicare Attestation I have personally reviewed: The patient's medical and social history Their use of alcohol, tobacco or illicit drugs Their current medications and supplements The patient's functional ability including ADLs,fall risks, home safety risks, cognitive, and hearing and visual impairment Diet and physical activities Evidence for depression   I have reviewed and discussed with patient certain preventive protocols, quality metrics, and best practice recommendations.     OBrien-Blaney, [place] Jorgensen L, LPN  9/[id number]/[id number]   I have reviewed the above information and agree with above.   Duncan Dull, MD

## 2019-12-20 ENCOUNTER — Encounter: Payer: Self-pay | Admitting: Family Medicine

## 2019-12-20 ENCOUNTER — Other Ambulatory Visit: Payer: Self-pay | Admitting: Internal Medicine

## 2019-12-20 ENCOUNTER — Telehealth: Payer: Self-pay | Admitting: Internal Medicine

## 2019-12-20 ENCOUNTER — Ambulatory Visit (INDEPENDENT_AMBULATORY_CARE_PROVIDER_SITE_OTHER): Payer: Medicare Other | Admitting: Family Medicine

## 2019-12-20 ENCOUNTER — Other Ambulatory Visit: Payer: Self-pay

## 2019-12-20 DIAGNOSIS — N904 Leukoplakia of vulva: Secondary | ICD-10-CM

## 2019-12-20 DIAGNOSIS — K5900 Constipation, unspecified: Secondary | ICD-10-CM

## 2019-12-20 MED ORDER — POLYETHYLENE GLYCOL 3350 17 GM/SCOOP PO POWD: 17.0000 g | Freq: Every day | ORAL | 0 refills | Status: DC | PRN

## 2019-12-20 NOTE — Patient Instructions (Signed)
Nice to see you. We will try MiraLAX for your constipation. If you are not having a good bowel movement in the next 3 to 4 days please let us know. We could consider having you see a dermatologist for the rash and to get their input on appropriate treatment.

## 2019-12-20 NOTE — Telephone Encounter (Signed)
Error pt will be seen in office

## 2019-12-20 NOTE — Progress Notes (Signed)
Debra Alar, MD Phone: [phone]  Debra Hubbard is a 86 y.o. female who presents today for same day visit.  Constipation: Started 3 days ago. She notes she has been constipated since going on Imdur though she changed her diet and that was quite beneficial to start with. She went on vacation and was not as active and is eating different things and subsequently developed constipation. Last bowel movement was Friday and she had to strain with some hard small balls of stool. No blood in her stool. No abdominal pain. No nausea or vomiting. No medications. She notes chronic issues with itching around her rectum and vulva that has been going on for years. She uses mometasone on this with good benefit. She rarely scratches. Previously diagnosed by prior PCP with lichen sclerosis.  Social History   Tobacco Use  Smoking Status Former Smoker  . Quit date: 08/20/1984  . Years since quitting: 35.3  Smokeless Tobacco Never Used     ROS see history of present illness  Objective  Physical Exam Vitals:   12/20/19 1115  BP: 122/70  Pulse: 73  Temp: 97.7 F (36.5 C)  SpO2: 96%    BP Readings from Last 3 Encounters:  12/20/19 122/70  02/09/19 (!) 92/50  01/28/18 106/70   Wt Readings from Last 3 Encounters:  12/20/19 108 lb 6.4 oz (49.2 kg)  08/25/19 109 lb (49.4 kg)  04/28/19 109 lb (49.4 kg)    Physical Exam Constitutional:      General: She is not in acute distress.    Appearance: She is not diaphoretic.  Cardiovascular:     Rate and Rhythm: Normal rate and regular rhythm.     Heart sounds: Normal heart sounds.  Pulmonary:     Effort: Pulmonary effort is normal.     Breath sounds: Normal breath sounds.  Abdominal:     General: Bowel sounds are normal. There is no distension.     Palpations: Abdomen is soft.     Tenderness: There is no abdominal tenderness. There is no guarding or rebound.  Genitourinary:    Comments: Wendie Simmer CMA, served as chaperone, area around  her rectum with slightly erythematous skin, no hypopigmentation noted, some excoriations noted Skin:    General: Skin is warm and dry.  Neurological:     Mental Status: She is alert.   Patient deferred exam of vulvar area.    Assessment/Plan: Please see individual problem list.  Constipation We will give her a trial of MiraLAX. Benign exam today. She will let us know if it is not improving.  Lichen sclerosus et atrophicus of the vulva Seems to be adequately controlled as there is no hypopigmentation though that does make me wonder if this would be the appropriate diagnosis. Discussed referral to dermatology though she declined as it has been stable for years.   No orders of the defined types were placed in this encounter.   Meds ordered this encounter  Medications  . polyethylene glycol powder (GLYCOLAX/MIRALAX) 17 GM/SCOOP powder    Sig: Take 17 g by mouth daily as needed for mild constipation.    Dispense:  500 g    Refill:  0    This visit occurred during the SARS-CoV-2 public health emergency.  Safety protocols were in place, including screening questions prior to the visit, additional usage of staff PPE, and extensive cleaning of exam room while observing appropriate contact time as indicated for disinfecting solutions.    Debra Alar, MD [facility] -  [employer]

## 2019-12-20 NOTE — Assessment & Plan Note (Addendum)
Seems to be adequately controlled as there is no hypopigmentation though that does make me wonder if this would be the appropriate diagnosis. Discussed referral to dermatology though she declined as it has been stable for years.

## 2019-12-20 NOTE — Assessment & Plan Note (Signed)
We will give her a trial of MiraLAX. Benign exam today. She will let us know if it is not improving.

## 2020-02-21 ENCOUNTER — Ambulatory Visit: Payer: Medicare Other | Admitting: Internal Medicine

## 2020-06-26 ENCOUNTER — Other Ambulatory Visit: Payer: Self-pay | Admitting: Internal Medicine

## 2020-08-28 ENCOUNTER — Ambulatory Visit (INDEPENDENT_AMBULATORY_CARE_PROVIDER_SITE_OTHER): Payer: Medicare Other

## 2020-08-28 ENCOUNTER — Other Ambulatory Visit: Payer: Self-pay

## 2020-08-28 VITALS — Ht 63.0 in | Wt 108.0 lb

## 2020-08-28 DIAGNOSIS — Z0000 Encounter for general adult medical examination without abnormal findings: Secondary | ICD-10-CM

## 2020-08-28 DIAGNOSIS — Z760 Encounter for issue of repeat prescription: Secondary | ICD-10-CM

## 2020-08-28 NOTE — Telephone Encounter (Signed)
Requests refill nitro-stat and mometasone. Last ordered 2020 and last office visit with pcp 12/2019. Next scheduled appointment 10/04/20 @ 2:00. Medications pended for approval.

## 2020-08-28 NOTE — Patient Instructions (Addendum)
[name] , Thank you for taking time to come for your Medicare Wellness Visit. I appreciate your ongoing commitment to your health goals. Please review the following plan we discussed and let me know if I can assist you in the future.   These are the goals we discussed: Goals    . Follow up with Primary Care Provider     As needed       This is a list of the screening recommended for you and due dates:  Health Maintenance  Topic Date Due  . COVID-19 Vaccine (1) 09/13/2020*  . DEXA scan (bone density measurement)  08/28/2021*  . Tetanus Vaccine  09/27/2021*  . Flu Shot  12/11/2020  . Pneumonia vaccines  Completed  . HPV Vaccine  Aged Out  *Topic was postponed. The date shown is not the original due date.    Immunizations Immunization History  Administered Date(s) Administered  . Fluad Quad(high Dose 65+) 04/27/2019  . Influenza Split 05/16/2011, 02/27/2012  . Influenza, High Dose Seasonal PF 03/13/2016, 01/28/2018  . Influenza,inj,Quad PF,6+ Mos 03/30/2013, 02/25/2014, 01/02/2015  . Influenza-Unspecified 02/07/2017  . Pneumococcal Conjugate-13 12/21/2013  . Pneumococcal-Unspecified 05/13/2004   Advanced directives: End of life planning; Advance aging; Advanced directives discussed.  Copy of current HCPOA/Living Will requested.    Conditions/risks identified: none new  Keep all routine maintenance appointments.   Follow up 10/04/20 @ 2:00  Next appointment: Follow up in one year for your annual wellness visit.   Preventive Care 67 Years and Older, Female Preventive care refers to lifestyle choices and visits with your health care provider that can promote health and wellness. What does preventive care include?  A yearly physical exam. This is also called an annual well check.  Dental exams once or twice a year.  Routine eye exams. Ask your health care provider how often you should have your eyes checked.  Personal lifestyle choices, including:  Daily care of  your teeth and gums.  Regular physical activity.  Eating a healthy diet.  Avoiding tobacco and drug use.  Limiting alcohol use.  Practicing safe sex.  Taking low-dose aspirin every day.  Taking vitamin and mineral supplements as recommended by your health care provider. What happens during an annual well check? The services and screenings done by your health care provider during your annual well check will depend on your age, overall health, lifestyle risk factors, and family history of disease. Counseling  Your health care provider may ask you questions about your:  Alcohol use.  Tobacco use.  Drug use.  Emotional well-being.  Home and relationship well-being.  Sexual activity.  Eating habits.  History of falls.  Memory and ability to understand (cognition).  Work and work Astronomer.  Reproductive health. Screening  You may have the following tests or measurements:  Height, weight, and BMI.  Blood pressure.  Lipid and cholesterol levels. These may be checked every 5 years, or more frequently if you are over 56 years old.  Skin check.  Lung cancer screening. You may have this screening every year starting at age 35 if you have a 30-pack-year history of smoking and currently smoke or have quit within the past 15 years.  Fecal occult blood test (FOBT) of the stool. You may have this test every year starting at age 36.  Flexible sigmoidoscopy or colonoscopy. You may have a sigmoidoscopy every 5 years or a colonoscopy every 10 years starting at age 82.  Hepatitis C blood test.  Hepatitis B blood  test.  Sexually transmitted disease (STD) testing.  Diabetes screening. This is done by checking your blood sugar (glucose) after you have not eaten for a while (fasting). You may have this done every 1-3 years.  Bone density scan. This is done to screen for osteoporosis. You may have this done starting at age 87.  Mammogram. This may be done every 1-2 years.  Talk to your health care provider about how often you should have regular mammograms. Talk with your health care provider about your test results, treatment options, and if necessary, the need for more tests. Vaccines  Your health care provider may recommend certain vaccines, such as:  Influenza vaccine. This is recommended every year.  Tetanus, diphtheria, and acellular pertussis (Tdap, Td) vaccine. You may need a Td booster every 10 years.  Zoster vaccine. You may need this after age 30.  Pneumococcal 13-valent conjugate (PCV13) vaccine. One dose is recommended after age 23.  Pneumococcal polysaccharide (PPSV23) vaccine. One dose is recommended after age 31. Talk to your health care provider about which screenings and vaccines you need and how often you need them. This information is not intended to replace advice given to you by your health care provider. Make sure you discuss any questions you have with your health care provider. Document Released: 05/26/2015 Document Revised: 01/17/2016 Document Reviewed: 02/28/2015 Elsevier Interactive Patient Education  2017 [employer].  Fall Prevention in the Home Falls can cause injuries. They can happen to people of all ages. There are many things you can do to make your home safe and to help prevent falls. What can I do on the outside of my home?  Regularly fix the edges of walkways and driveways and fix any cracks.  Remove anything that might make you trip as you walk through a door, such as a raised step or threshold.  Trim any bushes or trees on the path to your home.  Use bright outdoor lighting.  Clear any walking paths of anything that might make someone trip, such as rocks or tools.  Regularly check to see if handrails are loose or broken. Make sure that both sides of any steps have handrails.  Any raised decks and porches should have guardrails on the edges.  Have any leaves, snow, or ice cleared regularly.  Use sand or  salt on walking paths during winter.  Clean up any spills in your garage right away. This includes oil or grease spills. What can I do in the bathroom?  Use night lights.  Install grab bars by the toilet and in the tub and shower. Do not use towel bars as grab bars.  Use non-skid mats or decals in the tub or shower.  If you need to sit down in the shower, use a plastic, non-slip stool.  Keep the floor dry. Clean up any water that spills on the floor as soon as it happens.  Remove soap buildup in the tub or shower regularly.  Attach bath mats securely with double-sided non-slip rug tape.  Do not have throw rugs and other things on the floor that can make you trip. What can I do in the bedroom?  Use night lights.  Make sure that you have a light by your bed that is easy to reach.  Do not use any sheets or blankets that are too big for your bed. They should not hang down onto the floor.  Have a firm chair that has side arms. You can use this for support while  you get dressed.  Do not have throw rugs and other things on the floor that can make you trip. What can I do in the kitchen?  Clean up any spills right away.  Avoid walking on wet floors.  Keep items that you use a lot in easy-to-reach places.  If you need to reach something above you, use a strong step stool that has a grab bar.  Keep electrical cords out of the way.  Do not use floor polish or wax that makes floors slippery. If you must use wax, use non-skid floor wax.  Do not have throw rugs and other things on the floor that can make you trip. What can I do with my stairs?  Do not leave any items on the stairs.  Make sure that there are handrails on both sides of the stairs and use them. Fix handrails that are broken or loose. Make sure that handrails are as long as the stairways.  Check any carpeting to make sure that it is firmly attached to the stairs. Fix any carpet that is loose or worn.  Avoid having  throw rugs at the top or bottom of the stairs. If you do have throw rugs, attach them to the floor with carpet tape.  Make sure that you have a light switch at the top of the stairs and the bottom of the stairs. If you do not have them, ask someone to add them for you. What else can I do to help prevent falls?  Wear shoes that:  Do not have high heels.  Have rubber bottoms.  Are comfortable and fit you well.  Are closed at the toe. Do not wear sandals.  If you use a stepladder:  Make sure that it is fully opened. Do not climb a closed stepladder.  Make sure that both sides of the stepladder are locked into place.  Ask someone to hold it for you, if possible.  Clearly mark and make sure that you can see:  Any grab bars or handrails.  First and last steps.  Where the edge of each step is.  Use tools that help you move around (mobility aids) if they are needed. These include:  Canes.  Walkers.  Scooters.  Crutches.  Turn on the lights when you go into a dark area. Replace any light bulbs as soon as they burn out.  Set up your furniture so you have a clear path. Avoid moving your furniture around.  If any of your floors are uneven, fix them.  If there are any pets around you, be aware of where they are.  Review your medicines with your doctor. Some medicines can make you feel dizzy. This can increase your chance of falling. Ask your doctor what other things that you can do to help prevent falls. This information is not intended to replace advice given to you by your health care provider. Make sure you discuss any questions you have with your health care provider. Document Released: 02/23/2009 Document Revised: 10/05/2015 Document Reviewed: 06/03/2014 Elsevier Interactive Patient Education  2017 [employer].

## 2020-08-28 NOTE — Progress Notes (Addendum)
Subjective:   Debra Hubbard is a 87 y.o. female who presents for Medicare Annual (Subsequent) preventive examination.  Review of Systems    No ROS.  Medicare Wellness Virtual Visit.     Cardiac Risk Factors include: advanced age (>41men, >42 women)     Objective:    Today's Vitals   08/28/20 1034  Weight: 108 lb (49 kg)  Height:  (1.6 m)   Body mass index is 19.13 kg/m.  Advanced Directives 08/28/2020 08/25/2019 02/09/2019 07/20/2017 07/20/2017 07/09/2017 05/02/2017  Does Patient Have a Medical Advance Directive? Yes Yes Yes Yes Yes Yes No  Type of Estate agent of [place];Living will Living will;Healthcare Power of [employer] Power of [place];Living will Living will;Healthcare Power of [employer] Power of [place];Living will Healthcare Power of [place];Living will -  Does patient want to make changes to medical advance directive? No - Patient declined No - Patient declined No - Patient declined No - Patient declined - - -  Copy of Healthcare Power of Attorney in Chart? No - copy requested No - copy requested No - copy requested No - copy requested - - -    Current Medications (verified) Outpatient Encounter Medications as of 08/28/2020  Medication Sig   acetaminophen (TYLENOL) 500 MG tablet Take 500 mg by mouth every 6 (six) hours as needed for mild pain.    aspirin 81 MG chewable tablet Chew 1 tablet (81 mg total) by mouth daily.   calcium carbonate (CALCIUM 600) 600 MG TABS tablet Take 600 mg by mouth 2 (two) times daily with a meal.   carboxymethylcellul-glycerin (OPTIVE) 0.5-0.9 % ophthalmic solution Place 1 drop into both eyes daily as needed for dry eyes.   fluticasone (FLONASE) 50 MCG/ACT nasal spray Place 1 spray into both nostrils daily.   isosorbide mononitrate (IMDUR) 30 MG 24 hr tablet TAKE 1 TABLET BY MOUTH DAILY   mometasone (ELOCON) 0.1 % cream Apply 1 application topically as needed.   mometasone (ELOCON) 0.1 %  ointment APPLY TOPICALLY DAILY   Multiple Vitamins-Minerals (VISION-VITE PRESERVE PO) Take one by mouth twice a day.    nitroGLYCERIN (NITROSTAT) 0.4 MG SL tablet Place 0.4 mg under the tongue every 5 (five) minutes as needed for chest pain.   polyethylene glycol powder (GLYCOLAX/MIRALAX) 17 GM/SCOOP powder Take 17 g by mouth daily as needed for mild constipation.   Red Yeast Rice 600 MG CAPS Take 600 capsules by mouth daily.   No facility-administered encounter medications on file as of 08/28/2020.    Allergies (verified) Penicillins   History: Past Medical History:  Diagnosis Date   Cataracts, bilateral    Chest pain    a. H/o neg stress test ~ 1995.   Coronary atherosclerosis    a. 04/2017 CTA chest: coronary atherosclerosis noted ->pt refused statin.   Degenerative joint disease    rheumatoid arthritis   Gastritis    followed by Dr. Bluford Kaufmann   History of tobacco abuse    Macular degeneration    a. legally blind.   Pre-syncope    Squamous cell carcinoma    face, s/p excision   Past Surgical History:  Procedure Laterality Date   APPENDECTOMY  1957   BREAST BIOPSY     x 3, all normal   CATARACT EXTRACTION W/PHACO Left 02/09/2019   Procedure: CATARACT EXTRACTION PHACO AND INTRAOCULAR LENS PLACEMENT (IOC) LEFT 01:12.0        19.0%       13.72;  Surgeon: Druscilla Brownie,  Chrissie Noa, MD;  Location: [facility];  Service: Ophthalmology;  Laterality: Left;   CHOLECYSTECTOMY  1989   EXPLORATORY LAPAROTOMY  1963   OOPHORECTOMY  1957   Family History  Problem Relation Age of Onset   Cancer Neg Hx    Heart disease Neg Hx    Social History   Socioeconomic History   Marital status: Widowed    Spouse name: Not on file   Number of children: Not on file   Years of education: Not on file   Highest education level: Not on file  Occupational History   Not on file  Tobacco Use   Smoking status: Former Smoker    Quit date: 08/20/1984    Years since quitting: 36.0   Smokeless tobacco:  Never Used  Vaping Use   Vaping Use: Never used  Substance and Sexual Activity   Alcohol use: No   Drug use: No   Sexual activity: Not on file  Other Topics Concern   Not on file  Social History Narrative   Lives in retirement facility.  Relatively active but does not routinely exercise.   Social Determinants of Health   Financial Resource Strain: Low Risk    Difficulty of Paying Living Expenses: Not hard at all  Food Insecurity: No Food Insecurity   Worried About Programme researcher, broadcasting/film/video in the Last Year: Never true   Ran Out of Food in the Last Year: Never true  Transportation Needs: No Transportation Needs   Lack of Transportation (Medical): No   Lack of Transportation (Non-Medical): No  Physical Activity: Unknown   Days of Exercise per Week: 0 days   Minutes of Exercise per Session: Not on file  Stress: No Stress Concern Present   Feeling of Stress : Not at all  Social Connections: Unknown   Frequency of Communication with Friends and Family: More than three times a week   Frequency of Social Gatherings with Friends and Family: More than three times a week   Attends Religious Services: Not on Scientist, clinical (histocompatibility and immunogenetics) or Organizations: Not on file   Attends Banker Meetings: Not on file   Marital Status: Not on file    Tobacco Counseling Counseling given: Not Answered   Clinical Intake:  Pre-visit preparation completed: Yes        Diabetes: No  How often do you need to have someone help you when you read instructions, pamphlets, or other written materials from your doctor or pharmacy?: 1 - Never   Interpreter Needed?: No      Activities of Daily Living In your present state of health, do you have any difficulty performing the following activities: 08/28/2020  Hearing? Y  Comment Hearing aid  Vision? N  Difficulty concentrating or making decisions? N  Walking or climbing stairs? N  Dressing or bathing? N  Doing errands, shopping? Y   Comment She does not Engineer, manufacturing and eating ? N  Using the Toilet? N  In the past six months, have you accidently leaked urine? Y  Comment Managed with daily pad  Do you have problems with loss of bowel control? N  Managing your Medications? N  Managing your Finances? Y  Comment Family friend assist  Housekeeping or managing your Housekeeping? Y  Comment Maid assist  Some recent data might be hidden    Patient Care Team: Sherlene Shams, MD as PCP - General (Internal Medicine) Antonieta Iba, MD as PCP -  Cardiology (Cardiology)  Indicate any recent Medical Services you may have received from other than [facility] providers in the past year (date may be approximate).     Assessment:   This is a routine wellness examination for [place].  I connected with Nao today by telephone and verified that I am speaking with the correct person using two identifiers. Location patient: home Location provider: work Persons participating in the virtual visit: patient, Engineer, civil (consulting).    I discussed the limitations, risks, security and privacy concerns of performing an evaluation and management service by telephone and the availability of in person appointments. The patient expressed understanding and verbally consented to this telephonic visit.    Interactive audio and video telecommunications were attempted between this provider and patient, however failed, due to patient having technical difficulties OR patient did not have access to video capability.  We continued and completed visit with audio only.  Some vital signs may be absent or patient reported.   Hearing/Vision screen  Hearing Screening   125Hz  250Hz  500Hz  1000Hz  2000Hz  3000Hz  4000Hz  6000Hz  8000Hz   Right ear:           Left ear:           Comments: Hearing aid  Vision Screening Comments: Wears corrective lenses  Visual acuity not assessed, virtual visit. They have seen their ophthalmologist.  Dietary issues and exercise  activities discussed: Current Exercise Habits: The patient does not participate in regular exercise at present  Regular diet   Goals      Follow up with Primary Care Provider     As needed       Depression Screen [facility] 2/9 Scores 08/28/2020 08/25/2019 01/28/2018 05/16/2017 03/13/2016 12/21/2013 06/02/2012  PHQ - 2 Score 0 0 0 4 0 0 0  PHQ- 9 Score - - - 7 - - -    Fall Risk Fall Risk  08/28/2020 08/25/2019 04/28/2019 01/28/2018 03/13/2016  Falls in the past year? 0 0 0 No Yes  Number falls in past yr: 0 - - - 1  Injury with Fall? 0 - - - No  Follow up Falls evaluation completed Falls evaluation completed Falls evaluation completed - -    FALL RISK PREVENTION PERTAINING TO THE HOME: Handrails in use when climbing stairs? Yes Home free of loose throw rugs in walkways, pet beds, electrical cords, etc? Yes  Adequate lighting in your home to reduce risk of falls? Yes   ASSISTIVE DEVICES UTILIZED TO PREVENT FALLS: Life alert? Yes  Use of a cane, walker or w/c? No  Grab bars in the bathroom? Yes  Shower chair or bench in shower? No  Elevated toilet seat or a handicapped toilet? No   TIMED UP AND GO: Was the test performed? No . Virtual visit.    Cognitive Function:      6CIT Screen 08/28/2020 08/25/2019  What Year? 0 points 0 points  What month? 0 points 0 points  What time? 0 points 0 points  Count back from 20 0 points 0 points  Months in reverse - 0 points  Repeat phrase 0 points 0 points  Total Score - 0    Immunizations Immunization History  Administered Date(s) Administered   Fluad Quad(high Dose 65+) 04/27/2019   Influenza Split 05/16/2011, 02/27/2012   Influenza, High Dose Seasonal PF 03/13/2016, 01/28/2018   Influenza,inj,Quad PF,6+ Mos 03/30/2013, 02/25/2014, 01/02/2015   Influenza-Unspecified 02/07/2017   Pneumococcal Conjugate-13 12/21/2013   Pneumococcal-Unspecified 05/13/2004    TDAP status: Due, Education has been provided  regarding the importance of this  vaccine. Advised may receive this vaccine at local pharmacy or Health Dept. Aware to provide a copy of the vaccination record if obtained from local pharmacy or Health Dept. Verbalized acceptance and understanding. Deferred.   Health Maintenance Health Maintenance  Topic Date Due   COVID-19 Vaccine (1) 09/13/2020 (Originally 03/11/1932)   DEXA SCAN  08/28/2021 (Originally 03/11/1994)   TETANUS/TDAP  09/27/2021 (Originally 03/11/1946)   INFLUENZA VACCINE  12/11/2020   PNA vac Low Risk Adult  Completed   HPV VACCINES  Aged Out   Colorectal cancer screening: No longer required.   Mammogram status: No longer required due to aged out. .  Bone density- declined.   Covid vaccines- patient reports completed. Agrees to update immunization record next office visit.   Lung Cancer Screening: (Low Dose CT Chest recommended if Age 69-80 years, 30 pack-year currently smoking OR have quit w/in 15years.) does not qualify.   Vision Screening: Recommended annual ophthalmology exams for early detection of glaucoma and other disorders of the eye. Is the patient up to date with their annual eye exam?  Yes   Dental Screening: Recommended annual dental exams for proper oral hygiene.  Community Resource Referral / Chronic Care Management: CRR required this visit?  No   CCM required this visit?  No      Plan:   Keep all routine maintenance appointments.   Follow up 10/04/20 @ 2:00  I have personally reviewed and noted the following in the patient's chart:   Medical and social history Use of alcohol, tobacco or illicit drugs  Current medications and supplements Functional ability and status Nutritional status Physical activity Advanced directives List of other physicians Hospitalizations, surgeries, and ER visits in previous 12 months Vitals Screenings to include cognitive, depression, and falls Referrals and appointments  In addition, I have reviewed and discussed with patient certain  preventive protocols, quality metrics, and best practice recommendations. A written personalized care plan for preventive services as well as general preventive health recommendations were provided to patient via mail.     OBrien-Blaney, Anjolie Majer L, LPN   [id number]     I have reviewed the above information and agree with above.   Duncan Dull, MD

## 2020-08-29 MED ORDER — NITROGLYCERIN 0.4 MG SL SUBL: 0.4000 mg | SUBLINGUAL_TABLET | SUBLINGUAL | 0 refills | Status: AC | PRN

## 2020-08-29 MED ORDER — MOMETASONE FUROATE 0.1 % EX OINT: TOPICAL_OINTMENT | Freq: Every day | CUTANEOUS | 0 refills | Status: DC

## 2020-10-04 ENCOUNTER — Ambulatory Visit (INDEPENDENT_AMBULATORY_CARE_PROVIDER_SITE_OTHER): Payer: Medicare Other | Admitting: Internal Medicine

## 2020-10-04 ENCOUNTER — Other Ambulatory Visit: Payer: Self-pay

## 2020-10-04 ENCOUNTER — Encounter: Payer: Self-pay | Admitting: Internal Medicine

## 2020-10-04 VITALS — BP 102/62 | HR 75 | Temp 96.6°F | Resp 16 | Ht 63.0 in | Wt 106.0 lb

## 2020-10-04 DIAGNOSIS — R634 Abnormal weight loss: Secondary | ICD-10-CM | POA: Diagnosis not present

## 2020-10-04 DIAGNOSIS — I2510 Atherosclerotic heart disease of native coronary artery without angina pectoris: Secondary | ICD-10-CM | POA: Diagnosis not present

## 2020-10-04 DIAGNOSIS — Z8673 Personal history of transient ischemic attack (TIA), and cerebral infarction without residual deficits: Secondary | ICD-10-CM

## 2020-10-04 DIAGNOSIS — K5900 Constipation, unspecified: Secondary | ICD-10-CM

## 2020-10-04 DIAGNOSIS — E538 Deficiency of other specified B group vitamins: Secondary | ICD-10-CM | POA: Diagnosis not present

## 2020-10-04 DIAGNOSIS — H9193 Unspecified hearing loss, bilateral: Secondary | ICD-10-CM

## 2020-10-04 DIAGNOSIS — I700 Atherosclerosis of aorta: Secondary | ICD-10-CM

## 2020-10-04 DIAGNOSIS — S46211A Strain of muscle, fascia and tendon of other parts of biceps, right arm, initial encounter: Secondary | ICD-10-CM

## 2020-10-04 DIAGNOSIS — T466X5A Adverse effect of antihyperlipidemic and antiarteriosclerotic drugs, initial encounter: Secondary | ICD-10-CM

## 2020-10-04 MED ORDER — DICLOFENAC SODIUM 1 % EX GEL: 2.0000 g | Freq: Four times a day (QID) | CUTANEOUS | 5 refills | Status: DC

## 2020-10-04 NOTE — Progress Notes (Signed)
Subjective:  Patient ID: Debra Hubbard, female    DOB: [birth year]  Age: 87 y.o. MRN: [id number]  CC: The primary encounter diagnosis was B12 deficiency. Diagnoses of Weight loss, unintentional, Atherosclerosis of native coronary artery of native heart without angina pectoris, Constipation, unspecified constipation type, Adverse reaction to statin medication, History of TIA (transient ischemic attack), Bilateral hearing loss, unspecified hearing loss type, Thoracic aortic atherosclerosis (HCC), and Strain of right biceps, initial encounter were also pertinent to this visit.  HPI Debra Hubbard presents for follow up on chronic issues including atypical chest pain and hyperlipidemia .   Last seen oct 2020   This visit occurred during the SARS-CoV-2 public health emergency.  Safety protocols were in place, including screening questions prior to the visit, additional usage of staff PPE, and extensive cleaning of exam room while observing appropriate contact time as indicated for disinfecting solutions.   1) constipation: treated recent by ES .  Bowel habits have improved with better diet containing more fruit.  Not using miralax   2) Underweight:  Reviewed weights over the past 2 years.  Since last visit, s he has dropped 2  lbs.  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  Doesn't like to cook for one. Nibbles on fruit mostly.  Occasional makes grilled cheese .  Drinking Boost/Ensure  Daily.  She denies symptoms of depression,  And is very  grateful to be alive" although she does spend a considerable amount of time thinking about the chaos in the world and laments the decline of our national state. However she is not losing sleep worrying and  finds comfort in prayer and devotionals.    3) No history of falls.  Walks daily at [employer].  Wears a hearing aid but still  Has a hard time unless person shouts. Spends a lot  of time reading:  Reads a lot of Jonette Pesa novels ,  WWII historical novels  And mystery novel s.   4) Thoracic aortic atherosclerosis:  Reviewed findings of prior CT scan today..  Patient  has a past intolerance to  statin.  Discussed the role of statin therapy in stablizing placque and preventing events.  She remains unwilling to consider.     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 Has radiating  Pain with abduction of arm and ,   Outpatient Medications Prior to Visit  Medication Sig Dispense Refill  . acetaminophen (TYLENOL) 500 MG tablet Take 500 mg by mouth every 6 (six) hours as needed for mild pain.     Marland Kitchen aspirin 81 MG chewable tablet Chew 1 tablet (81 mg total) by mouth daily. 90 tablet 1  . calcium carbonate (OS-CAL) 600 MG TABS tablet Take 600 mg by mouth 2 (two) times daily with a meal.    . carboxymethylcellul-glycerin (REFRESH OPTIVE) 0.5-0.9 % ophthalmic solution Place 1 drop into both eyes daily as needed for dry eyes.    . fluticasone (FLONASE) 50 MCG/ACT nasal spray Place 1 spray into both nostrils daily.    . isosorbide mononitrate (IMDUR) 30 MG 24 hr tablet TAKE 1 TABLET BY MOUTH DAILY 90 tablet 1  . mometasone (ELOCON) 0.1 % ointment Apply topically daily. 45 g 0  . Multiple Vitamins-Minerals (VISION-VITE PRESERVE PO) Take one by mouth twice  a day.    . nitroGLYCERIN (NITROSTAT) 0.4 MG SL tablet Place 1 tablet (0.4 mg total) under the tongue every 5 (five) minutes as needed for chest pain. 30 tablet 0  . Red Yeast Rice 600 MG CAPS Take 600 capsules by mouth daily.    . polyethylene glycol powder (GLYCOLAX/MIRALAX) 17 GM/SCOOP powder Take 17 g by mouth daily as needed for mild constipation. (Patient not taking: Reported on 10/04/2020) 500 g 0  . mometasone (ELOCON) 0.1 % cream Apply 1 application topically as needed. (Patient not taking: Reported on 10/04/2020)     No facility-administered  medications prior to visit.    Review of Systems;  Patient denies headache, fevers, malaise, unintentional weight loss, skin rash, eye pain, sinus congestion and sinus pain, sore throat, dysphagia,  hemoptysis , cough, dyspnea, wheezing, chest pain, palpitations, orthopnea, edema, abdominal pain, nausea, melena, diarrhea, constipation, flank pain, dysuria, hematuria, urinary  Frequency, nocturia, numbness, tingling, seizures,  Focal weakness, Loss of consciousness,  Tremor, insomnia, depression, anxiety, and suicidal ideation.      Objective:  BP 102/62 (BP Location: Left Arm, Patient Position: Sitting, Cuff Size: Normal)   Pulse 75   Temp (!) 96.6 F (35.9 C) (Temporal)   Resp 16   Ht 5\' 3"  (1.6 m)   Wt 106 lb (48.1 kg)   SpO2 95%   BMI 18.78 kg/m   BP Readings from Last 3 Encounters:  10/04/20 102/62  12/20/19 122/70  02/09/19 (!) 92/50    Wt Readings from Last 3 Encounters:  10/04/20 106 lb (48.1 kg)  08/28/20 108 lb (49 kg)  12/20/19 108 lb 6.4 oz (49.2 kg)    General appearance: alert, cooperative and appears stated age Ears: normal TM's and external ear canals both ears Throat: lips, mucosa, and tongue normal; teeth and gums normal Neck: no adenopathy, no carotid bruit, supple, symmetrical, trachea midline and thyroid not enlarged, symmetric, no tenderness/mass/nodules Back: symmetric, no curvature. ROM normal. No CVA tenderness. Lungs: clear to auscultation bilaterally Heart: regular rate and rhythm, S1, S2 normal, no murmur, click, rub or gallop Abdomen: soft, non-tender; bowel sounds normal; no masses,  no organomegaly MSK:  No pain with passive ROM ,  No joint tenderness  Pulses: 2+ and symmetric Skin: Skin color, texture, turgor normal. No rashes or lesions Lymph nodes: Cervical, supraclavicular, and axillary nodes normal. Neuro:  awake and interactive with normal mood and affect. Higher cortical functions are normal. Speech is clear without word-finding  difficulty or dysarthria. Extraocular movements are intact. Visual fields of both eyes are grossly intact. Sensation to light touch is grossly intact bilaterally of upper and lower extremities. Motor examination shows 4+/5 symmetric hand grip and upper extremity and 5/5 lower extremity strength. There is no pronation or drift. Gait is non-ataxic    No results found for: HGBA1C  Lab Results  Component Value Date   CREATININE 0.57 10/04/2020   CREATININE 0.55 10/14/2018   CREATININE 0.54 01/28/2018    Lab Results  Component Value Date   WBC 7.8 07/21/2017   HGB 13.3 07/21/2017   HCT 39.4 07/21/2017   PLT 205 07/21/2017   GLUCOSE 87 10/04/2020   CHOL 133 10/04/2020   TRIG 71.0 10/04/2020   HDL 45.60 10/04/2020   LDLCALC 73 10/04/2020   ALT 11 10/04/2020   AST 17 10/04/2020   NA 137 10/04/2020   K 4.3 10/04/2020   CL 96 10/04/2020   CREATININE 0.57 10/04/2020   BUN 18 10/04/2020   CO2 33 (  H) 10/04/2020   TSH 1.47 01/28/2018    No results found.  Assessment & Plan:   Problem List Items Addressed This Visit      Unprioritized   Adverse reaction to statin medication    She refuses repeat trial of statin due to prior trial resulting in memory loss       B12 deficiency - Primary    Recurrent ,  With positive IF ab in 2020.  Requiring parenteral supplementation FOR LIFE/   Lab Results  Component Value Date   VITAMINB12 116 (L) 10/04/2020        Relevant Orders   Vitamin B12 (Completed)   Constipation    Improved with increased fruit intake      Coronary atherosclerosis    Incidental finding on CT done  To follow up on LUL nodule seen on xray.  2 vessel CAD mentioned.    She has not had any episodes of chest pain since resuming Imdur, declines statin due to prior adverse effect on memory .  Tolerating RYR  Follow up  Semi annually with [facility]       Relevant Orders   Lipid panel (Completed)   Hearing loss    She remains hard of hearing despite use of hearing  aid      History of TIA (transient ischemic attack)   Strain of right biceps    Mild,  Based on exam and history. Improving steadily      Thoracic aortic atherosclerosis (HCC)    Seen on 2019 chest CT along with 2 vessel disease.  She refuses repeat trial of statin due to memory loss with previous trial.  LDL is 73 with RYR therapy.  Continue asa   Lab Results  Component Value Date   CHOL 133 10/04/2020   HDL 45.60 10/04/2020   LDLCALC 73 10/04/2020   TRIG 71.0 10/04/2020   CHOLHDL 3 10/04/2020         Weight loss, unintentional     I have reviewed her diet and recommended that she increase her protein and fat intake while monitoring her carbohydrates.       Relevant Orders   Comprehensive metabolic panel (Completed)      I am having Debra Hubbard start on diclofenac Sodium. I am also having her maintain her Multiple Vitamins-Minerals (VISION-VITE PRESERVE PO), carboxymethylcellul-glycerin, acetaminophen, Red Yeast Rice, calcium carbonate, aspirin, fluticasone, polyethylene glycol powder, isosorbide mononitrate, mometasone, and nitroGLYCERIN.  Meds ordered this encounter  Medications  . diclofenac Sodium (VOLTAREN) 1 % GEL    Sig: Apply 2 g topically 4 (four) times daily.    Dispense:  100 g    Refill:  5    Medications Discontinued During This Encounter  Medication Reason  . mometasone (ELOCON) 0.1 % cream Duplicate    Follow-up: Return in about 1 year (around 10/04/2021).   Sherlene Shams, MD

## 2020-10-04 NOTE — Patient Instructions (Addendum)
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

## 2020-10-05 LAB — COMPREHENSIVE METABOLIC PANEL
ALT: 11 U/L (ref 0–35)
AST: 17 U/L (ref 0–37)
Albumin: 4.3 g/dL (ref 3.5–5.2)
Alkaline Phosphatase: 97 U/L (ref 39–117)
BUN: 18 mg/dL (ref 6–23)
CO2: 33 mEq/L — ABNORMAL HIGH (ref 19–32)
Calcium: 10 mg/dL (ref 8.4–10.5)
Chloride: 96 mEq/L (ref 96–112)
Creatinine, Ser: 0.57 mg/dL (ref 0.40–1.20)
GFR: 79.36 mL/min (ref >60.00)
Glucose, Bld: 87 mg/dL (ref 70–99)
Potassium: 4.3 mEq/L (ref 3.5–5.1)
Sodium: 137 mEq/L (ref 135–145)
Total Bilirubin: 0.5 mg/dL (ref 0.2–1.2)
Total Protein: 7.4 g/dL (ref 6.0–8.3)

## 2020-10-05 LAB — LIPID PANEL
Cholesterol: 133 mg/dL (ref 0–200)
HDL: 45.6 mg/dL (ref >39.00)
LDL Cholesterol: 73 mg/dL (ref 0–99)
NonHDL: 87.14
Total CHOL/HDL Ratio: 3
Triglycerides: 71 mg/dL (ref 0.0–149.0)
VLDL: 14.2 mg/dL (ref 0.0–40.0)

## 2020-10-05 LAB — VITAMIN B12: Vitamin B-12: 116 pg/mL — ABNORMAL LOW (ref 211–911)

## 2020-10-06 DIAGNOSIS — S46211A Strain of muscle, fascia and tendon of other parts of biceps, right arm, initial encounter: Secondary | ICD-10-CM | POA: Insufficient documentation

## 2020-10-06 DIAGNOSIS — T466X5A Adverse effect of antihyperlipidemic and antiarteriosclerotic drugs, initial encounter: Secondary | ICD-10-CM | POA: Insufficient documentation

## 2020-10-06 DIAGNOSIS — I700 Atherosclerosis of aorta: Secondary | ICD-10-CM | POA: Insufficient documentation

## 2020-10-06 NOTE — Assessment & Plan Note (Signed)
She remains hard of hearing despite use of hearing aid

## 2020-10-06 NOTE — Assessment & Plan Note (Signed)
Mild,  Based on exam and history. Improving steadily

## 2020-10-06 NOTE — Assessment & Plan Note (Signed)
Improved with increased fruit intake

## 2020-10-06 NOTE — Assessment & Plan Note (Signed)
Seen on 2019 chest CT along with 2 vessel disease.  She refuses repeat trial of statin due to memory loss with previous trial.  LDL is 73 with RYR therapy.  Continue asa   Lab Results  Component Value Date   CHOL 133 10/04/2020   HDL 45.60 10/04/2020   LDLCALC 73 10/04/2020   TRIG 71.0 10/04/2020   CHOLHDL 3 10/04/2020

## 2020-10-06 NOTE — Assessment & Plan Note (Signed)
Incidental finding on CT done  To follow up on LUL nodule seen on xray.  2 vessel CAD mentioned.    She has not had any episodes of chest pain since resuming Imdur, declines statin due to prior adverse effect on memory .  Tolerating RYR  Follow up  Semi annually with [facility]

## 2020-10-06 NOTE — Assessment & Plan Note (Signed)
I have reviewed her diet and recommended that she increase her protein and fat intake while monitoring her carbohydrates.  

## 2020-10-06 NOTE — Assessment & Plan Note (Signed)
She refuses repeat trial of statin due to prior trial resulting in memory loss

## 2020-10-06 NOTE — Assessment & Plan Note (Addendum)
Recurrent ,  With positive IF ab in 2020.  Requiring parenteral supplementation FOR LIFE/   Lab Results  Component Value Date   VITAMINB12 116 (L) 10/04/2020

## 2020-10-10 ENCOUNTER — Telehealth: Payer: Self-pay | Admitting: Internal Medicine

## 2020-10-10 MED ORDER — CYANOCOBALAMIN 1000 MCG/ML IJ SOLN: INTRAMUSCULAR | 3 refills | Status: AC

## 2020-10-10 NOTE — Telephone Encounter (Signed)
Patient called in about her b12 that Dr.Tullo gave her

## 2020-10-10 NOTE — Telephone Encounter (Signed)
Spoke with pt and she stated in order for her to be able to get the b12 injections at her facility she needs a rx sent to the pharmacy and a signed order from Dr. Darrick Huntsman. rx has been sent in and order has been placed in quick sign folder.

## 2020-10-12 NOTE — Telephone Encounter (Signed)
Order has been signed and faxed to the nurse at the [facility] of [place].

## 2020-12-18 ENCOUNTER — Other Ambulatory Visit: Payer: Self-pay | Admitting: Internal Medicine

## 2021-07-04 ENCOUNTER — Other Ambulatory Visit: Payer: Self-pay | Admitting: Internal Medicine

## 2021-08-21 ENCOUNTER — Other Ambulatory Visit: Payer: Self-pay | Admitting: Internal Medicine

## 2021-08-21 DIAGNOSIS — Z760 Encounter for issue of repeat prescription: Secondary | ICD-10-CM

## 2021-08-29 ENCOUNTER — Telehealth: Payer: Self-pay

## 2021-08-29 ENCOUNTER — Ambulatory Visit: Payer: Medicare Other

## 2021-08-29 NOTE — Telephone Encounter (Signed)
No answer when called for scheduled AWV. Left message to reschedule.  ?

## 2021-09-06 ENCOUNTER — Telehealth: Payer: Self-pay | Admitting: Internal Medicine

## 2021-09-06 NOTE — Telephone Encounter (Signed)
Called Pt... Pt answer phone and then hung the phone up... Called pt back.... No answer... LVMTCB.... Provider will be out of office on 10/05/2021... Please reschedule pt  ?

## 2021-10-05 ENCOUNTER — Ambulatory Visit: Payer: Medicare Other | Admitting: Internal Medicine

## 2021-10-16 ENCOUNTER — Telehealth: Payer: Self-pay | Admitting: Internal Medicine

## 2021-10-16 NOTE — Telephone Encounter (Signed)
Copied from CRM [id number]. Topic: Medicare AWV >> Oct 16, 2021  2:08 PM Harris-Coley, Avon Gully wrote: Reason for CRM: Left message for patient to schedule Annual Wellness Visit.  Please schedule with Nurse Health Advisor Denisa O'Brien-Blaney, LPN at [facility].  Please call [phone] ask for [facility]

## 2021-10-18 ENCOUNTER — Ambulatory Visit (INDEPENDENT_AMBULATORY_CARE_PROVIDER_SITE_OTHER): Payer: Medicare Other | Admitting: Internal Medicine

## 2021-10-18 ENCOUNTER — Encounter: Payer: Self-pay | Admitting: Internal Medicine

## 2021-10-18 ENCOUNTER — Ambulatory Visit (INDEPENDENT_AMBULATORY_CARE_PROVIDER_SITE_OTHER): Payer: Medicare Other

## 2021-10-18 VITALS — BP 126/68 | HR 72 | Temp 97.5°F | Ht 63.0 in | Wt 100.6 lb

## 2021-10-18 DIAGNOSIS — Z7189 Other specified counseling: Secondary | ICD-10-CM | POA: Diagnosis not present

## 2021-10-18 DIAGNOSIS — I2510 Atherosclerotic heart disease of native coronary artery without angina pectoris: Secondary | ICD-10-CM

## 2021-10-18 DIAGNOSIS — R053 Chronic cough: Secondary | ICD-10-CM

## 2021-10-18 DIAGNOSIS — R634 Abnormal weight loss: Secondary | ICD-10-CM

## 2021-10-18 DIAGNOSIS — I700 Atherosclerosis of aorta: Secondary | ICD-10-CM

## 2021-10-18 DIAGNOSIS — I208 Other forms of angina pectoris: Secondary | ICD-10-CM | POA: Diagnosis not present

## 2021-10-18 DIAGNOSIS — N904 Leukoplakia of vulva: Secondary | ICD-10-CM

## 2021-10-18 LAB — CBC WITH DIFFERENTIAL/PLATELET
Basophils Absolute: 0.1 10*3/uL (ref 0.0–0.1)
Basophils Relative: 0.7 % (ref 0.0–3.0)
Eosinophils Absolute: 0.2 10*3/uL (ref 0.0–0.7)
Eosinophils Relative: 2.3 % (ref 0.0–5.0)
HCT: 40.1 % (ref 36.0–46.0)
Hemoglobin: 13.4 g/dL (ref 12.0–15.0)
Lymphocytes Relative: 28.7 % (ref 12.0–46.0)
Lymphs Abs: 2.1 10*3/uL (ref 0.7–4.0)
MCHC: 33.5 g/dL (ref 30.0–36.0)
MCV: 91.8 fl (ref 78.0–100.0)
Monocytes Absolute: 0.9 10*3/uL (ref 0.1–1.0)
Monocytes Relative: 13 % — ABNORMAL HIGH (ref 3.0–12.0)
Neutro Abs: 4 10*3/uL (ref 1.4–7.7)
Neutrophils Relative %: 55.3 % (ref 43.0–77.0)
Platelets: 204 10*3/uL (ref 150.0–400.0)
RBC: 4.37 Mil/uL (ref 3.87–5.11)
RDW: 13.6 % (ref 11.5–15.5)
WBC: 7.2 10*3/uL (ref 4.0–10.5)

## 2021-10-18 LAB — COMPREHENSIVE METABOLIC PANEL
ALT: 10 U/L (ref 0–35)
AST: 16 U/L (ref 0–37)
Albumin: 4.1 g/dL (ref 3.5–5.2)
Alkaline Phosphatase: 88 U/L (ref 39–117)
BUN: 21 mg/dL (ref 6–23)
CO2: 33 mEq/L — ABNORMAL HIGH (ref 19–32)
Calcium: 9.8 mg/dL (ref 8.4–10.5)
Chloride: 101 mEq/L (ref 96–112)
Creatinine, Ser: 0.53 mg/dL (ref 0.40–1.20)
GFR: 80.18 mL/min (ref >60.00)
Glucose, Bld: 89 mg/dL (ref 70–99)
Potassium: 4 mEq/L (ref 3.5–5.1)
Sodium: 139 mEq/L (ref 135–145)
Total Bilirubin: 0.4 mg/dL (ref 0.2–1.2)
Total Protein: 6.9 g/dL (ref 6.0–8.3)

## 2021-10-18 LAB — TSH: TSH: 1.55 u[IU]/mL (ref 0.35–5.50)

## 2021-10-18 MED ORDER — CLOBETASOL PROPIONATE 0.05 % EX OINT: 1.0000 "application " | TOPICAL_OINTMENT | Freq: Two times a day (BID) | CUTANEOUS | 2 refills | Status: DC

## 2021-10-18 NOTE — Patient Instructions (Addendum)
I have refilled your ointment that Dr Gilford Rile used to give you.   For your allergies ,  You can use Benadryl at nighttime,  but you should also consider adding one of these newer second generation antihistamines that are longer acting, non sedating and  available OTC:  Generic  Zyrtec, which is cetirizine.    generic Allegra , available generically as fexofenadine ; comes in 60 mg and 180 mg once daily strengths.    Generic Claritin :  also available as loratidine .     I have signed a DO NOT RESUSCITATE ORDER  . You should keep this visible in your home to prevent EMS from bringing you back to life or doing CPR if you have become unresponsive

## 2021-10-18 NOTE — Progress Notes (Unsigned)
Subjective:  Patient ID: Debra Hubbard, female    DOB: [birth year]  Age: 88 y.o. MRN: [id number]  CC: The primary encounter diagnosis was Chronic cough. Diagnoses of DNR (do not resuscitate) discussion, Weight loss, Lichen sclerosus et atrophicus of the vulva, Stable angina (HCC), Atherosclerosis of native coronary artery of native heart without angina pectoris, Weight loss, unintentional, and Thoracic aortic atherosclerosis (HCC) were also pertinent to this visit.   HPI Debra Hubbard presents for  Chief Complaint  Patient presents with   Acute Visit    cough   1) Chronic cough ,  accompanied by rhinitis and sneezing .  Denies fevers,  body aches,  orthopnea and pleurisy  2) Loneliness;  misses her family who have all died . Still has a routine of rising at 4 am .  Goes for a walk most days, Spends her days  reading,  napping.  Has lost most of her vision  due to macular degeneration ([place] [facility]), still getting injections ,and hearing has become poor.  Left eye blind , right ear deaf .  Lives independently in the [place] of [place]. Predicts she will be blind by end of year.  Planning to die at home.  Emphatic about not going to [facility]   3) vaginal itching .  Last sexual  encounter  30 years ago .  Records reviewed, she  RAN OUT OF MEDICATION FOR  Chronic  lichen sclerosis et atrophic which she has been using  for over 12 years  (office declined refill  due to last visit being > 1 year ago.   Records reviewed,  she was receiving clobetasol      Outpatient Medications Prior to Visit  Medication Sig Dispense Refill   acetaminophen (TYLENOL) 500 MG tablet Take 500 mg by mouth every 6 (six) hours as needed for mild pain.      aspirin 81 MG chewable tablet Chew 1 tablet (81 mg total) by mouth daily. 90 tablet 1   calcium carbonate (OS-CAL) 600 MG TABS tablet Take 600 mg by mouth 2 (two) times daily with a meal.     carboxymethylcellul-glycerin (REFRESH OPTIVE) 0.5-0.9 %  ophthalmic solution Place 1 drop into both eyes daily as needed for dry eyes.     cyanocobalamin (,VITAMIN B-12,) 1000 MCG/ML injection Inject 1 mL once weekly for 3 weeks and then once monthly. 10 mL 3   diclofenac Sodium (VOLTAREN) 1 % GEL Apply 2 g topically 4 (four) times daily. 100 g 5   fluticasone (FLONASE) 50 MCG/ACT nasal spray Place 1 spray into both nostrils daily.     isosorbide mononitrate (IMDUR) 30 MG 24 hr tablet TAKE 1 TABLET BY MOUTH DAILY 90 tablet 1   mometasone (ELOCON) 0.1 % ointment APPLY TOPICALLY EVERY DAY 45 g 0   Multiple Vitamins-Minerals (VISION-VITE PRESERVE PO) Take one by mouth twice a day.     nitroGLYCERIN (NITROSTAT) 0.4 MG SL tablet Place 1 tablet (0.4 mg total) under the tongue every 5 (five) minutes as needed for chest pain. 30 tablet 0   polyethylene glycol powder (GLYCOLAX/MIRALAX) 17 GM/SCOOP powder Take 17 g by mouth daily as needed for mild constipation. 500 g 0   Red Yeast Rice 600 MG CAPS Take 600 capsules by mouth daily.     No facility-administered medications prior to visit.    Review of Systems;  Patient denies headache, fevers, malaise, unintentional weight loss, skin rash, eye pain, sinus congestion and sinus pain, sore throat, dysphagia,  hemoptysis , , dyspnea, wheezing, chest pain, palpitations, orthopnea, edema, abdominal pain, nausea, melena, diarrhea, constipation, flank pain, dysuria, hematuria, urinary  Frequency, nocturia, numbness, tingling, seizures,  Focal weakness, Loss of consciousness,  Tremor, insomnia, depression, anxiety, and suicidal ideation.      Objective:  BP 126/68 (BP Location: Left Arm, Patient Position: Sitting, Cuff Size: Normal)   Pulse 72   Temp (!) 97.5 F (36.4 C) (Oral)   Ht 5\' 3"  (1.6 m)   Wt 100 lb 9.6 oz (45.6 kg)   SpO2 96%   BMI 17.82 kg/m   BP Readings from Last 3 Encounters:  10/18/21 126/68  10/04/20 102/62  12/20/19 122/70    Wt Readings from Last 3 Encounters:  10/18/21 100 lb 9.6 oz  (45.6 kg)  10/04/20 106 lb (48.1 kg)  08/28/20 108 lb (49 kg)    General appearance: alert, cooperative and appears stated age Ears: normal TM's and external ear canals both ears Throat: lips, mucosa, and tongue normal; teeth and gums normal Neck: no adenopathy, no carotid bruit, supple, symmetrical, trachea midline and thyroid not enlarged, symmetric, no tenderness/mass/nodules Back: symmetric, no curvature. ROM normal. No CVA tenderness. Lungs: clear to auscultation bilaterally Heart: regular rate and rhythm, S1, S2 normal, no murmur, click, rub or gallop Abdomen: soft, non-tender; bowel sounds normal; no masses,  no organomegaly Pulses: 2+ and symmetric Skin: Skin color, texture, turgor normal. No rashes or lesions Lymph nodes: Cervical, supraclavicular, and axillary nodes normal. Neuro:  awake and interactive with normal mood and affect. Higher cortical functions are normal. Speech is clear without word-finding difficulty or dysarthria. Extraocular movements are intact. Visual fields of both eyes are grossly intact. Sensation to light touch is grossly intact bilaterally of upper and lower extremities. Motor examination shows 4+/5 symmetric hand grip and upper extremity and 5/5 lower extremity strength. There is no pronation or drift. Gait is non-ataxic   No results found for: "HGBA1C"  Lab Results  Component Value Date   CREATININE 0.53 10/18/2021   CREATININE 0.57 10/04/2020   CREATININE 0.55 10/14/2018    Lab Results  Component Value Date   WBC 7.2 10/18/2021   HGB 13.4 10/18/2021   HCT 40.1 10/18/2021   PLT 204.0 10/18/2021   GLUCOSE 89 10/18/2021   CHOL 133 10/04/2020   TRIG 71.0 10/04/2020   HDL 45.60 10/04/2020   LDLCALC 73 10/04/2020   ALT 10 10/18/2021   AST 16 10/18/2021   NA 139 10/18/2021   K 4.0 10/18/2021   CL 101 10/18/2021   CREATININE 0.53 10/18/2021   BUN 21 10/18/2021   CO2 33 (H) 10/18/2021   TSH 1.55 10/18/2021    No results  found.  Assessment & Plan:   Problem List Items Addressed This Visit     Chronic cough - Primary    Chest x ray done to rule out endobronchial mass  And atypical PNA      Relevant Orders   DG Chest 2 View (Completed)   Coronary atherosclerosis    Incidental finding on CT done  To follow up on LUL nodule seen on xray.  2 vessel CAD mentioned.    She has not had any episodes of chest pain since resuming Imdur, declines statin due to prior adverse effect on memory .  Tolerating RYR  Follow up  Semi annually with [facility]       DNR (do not resuscitate) discussion    DNR status has been requested by patient after risks and benefits of  the order were discussed today .  Order given to patient and placed in chart.      Relevant Orders   DNR (Do Not Resuscitate)   Lichen sclerosus et atrophicus of the vulva    Managed with clobetasol cream.  Refill given       Stable angina (HCC)     She has not had any episodes of chest pain since resuming Imdur, declines statin due to prior adverse effect on memory .  Tolerating RYR  Follow up  Semi annually with [facility]       Thoracic aortic atherosclerosis (HCC)    Seen on 2019 chest CT along with 2 vessel disease.  She refuses repeat trial of statin due to memory loss with previous trial.  LDL is 73 with RYR therapy.  Continue asa   Lab Results  Component Value Date   CHOL 133 10/04/2020   HDL 45.60 10/04/2020   LDLCALC 73 10/04/2020   TRIG 71.0 10/04/2020   CHOLHDL 3 10/04/2020         Weight loss, unintentional    CBC, renal and liver function done today,  Along with  Chest x ray normal.       Other Visit Diagnoses     Weight loss       Relevant Orders   Comprehensive metabolic panel (Completed)   CBC with Differential/Platelet (Completed)   TSH (Completed)       I spent a total of 30  minutes with this patient in a face to face visit on the date of this encounter reviewing the last office visit with me  in 2022.  Her most  recent with patient's cardiologist ,     ,  patient'ss diet and eating habits,   most recent imaging study ,   and post visit ordering of testing and therapeutics.    Follow-up: Return in about 6 months (around 04/19/2022).   Sherlene Shams, MD

## 2021-10-20 DIAGNOSIS — R053 Chronic cough: Secondary | ICD-10-CM | POA: Insufficient documentation

## 2021-10-20 NOTE — Assessment & Plan Note (Signed)
Managed with clobetasol cream.  Refill given

## 2021-10-20 NOTE — Assessment & Plan Note (Signed)
Chest x ray done to rule out endobronchial mass  And atypical PNA

## 2021-10-20 NOTE — Assessment & Plan Note (Signed)
CBC, renal and liver function done today,  Along with  Chest x ray normal.

## 2021-10-20 NOTE — Assessment & Plan Note (Signed)
Incidental finding on CT done  To follow up on LUL nodule seen on xray.  2 vessel CAD mentioned.    She has not had any episodes of chest pain since resuming Imdur, declines statin due to prior adverse effect on memory .  Tolerating RYR  Follow up  Semi annually with [facility]  

## 2021-10-20 NOTE — Assessment & Plan Note (Signed)
She has not had any episodes of chest pain since resuming Imdur, declines statin due to prior adverse effect on memory .  Tolerating RYR  Follow up  [facility]

## 2021-10-20 NOTE — Assessment & Plan Note (Signed)
Seen on 2019 chest CT along with 2 vessel disease.  She refuses repeat trial of statin due to memory loss with previous trial.  LDL is 73 with RYR therapy.  Continue asa   Lab Results  Component Value Date   CHOL 133 10/04/2020   HDL 45.60 10/04/2020   LDLCALC 73 10/04/2020   TRIG 71.0 10/04/2020   CHOLHDL 3 10/04/2020    

## 2021-10-20 NOTE — Assessment & Plan Note (Signed)
DNR status has been requested by patient after risks and benefits of the order were discussed today .  Order given to patient and placed in chart. ?

## 2021-10-22 ENCOUNTER — Other Ambulatory Visit: Payer: Self-pay

## 2021-10-22 DIAGNOSIS — E559 Vitamin D deficiency, unspecified: Secondary | ICD-10-CM

## 2021-10-22 DIAGNOSIS — R634 Abnormal weight loss: Secondary | ICD-10-CM

## 2021-10-22 DIAGNOSIS — I2510 Atherosclerotic heart disease of native coronary artery without angina pectoris: Secondary | ICD-10-CM

## 2021-10-22 DIAGNOSIS — E538 Deficiency of other specified B group vitamins: Secondary | ICD-10-CM

## 2021-11-26 ENCOUNTER — Telehealth: Payer: Self-pay | Admitting: Internal Medicine

## 2021-11-26 NOTE — Telephone Encounter (Signed)
Spoke with patient she req CB Wed 9/19 at 9:45am

## 2021-12-03 ENCOUNTER — Telehealth: Payer: Self-pay | Admitting: Internal Medicine

## 2021-12-03 NOTE — Telephone Encounter (Signed)
Copied from CRM [id number]. Topic: Medicare AWV >> Dec 03, 2021  1:17 PM Payton Doughty wrote: Reason for CRM: Left message for patient to schedule Annual Wellness Visit.  Please schedule with Nurse Health Advisor Denisa O'Brien-Blaney, LPN at [facility]. This appt can be telephone or office visit.  Please call [phone] ask for [facility]

## 2021-12-03 NOTE — Telephone Encounter (Signed)
Patient returned [facility] Harris-Coley's call.  I read Kathy's message to patient.  Patient states she will call the number Olegario Messier gave to schedule her appointment.

## 2021-12-05 ENCOUNTER — Ambulatory Visit (INDEPENDENT_AMBULATORY_CARE_PROVIDER_SITE_OTHER): Payer: Medicare Other

## 2021-12-05 VITALS — Ht 63.0 in | Wt 100.0 lb

## 2021-12-05 DIAGNOSIS — Z0000 Encounter for general adult medical examination without abnormal findings: Secondary | ICD-10-CM

## 2021-12-05 NOTE — Progress Notes (Addendum)
Subjective:   Debra Hubbard is a 88 y.o. female who presents for Medicare Annual (Subsequent) preventive examination.  Review of Systems    No ROS.  Medicare Wellness Virtual Visit.  Visual/audio telehealth visit, UTA vital signs.   See social history for additional risk factors.         Objective:    Today's Vitals   12/05/21 1237  Weight: 100 lb (45.4 kg)  Height: 5\' 3"  (1.6 m)   Body mass index is 17.71 kg/m.     12/05/2021   12:48 PM 08/28/2020   10:44 AM 08/25/2019   11:29 AM 02/09/2019    6:55 AM 07/20/2017   11:10 PM 07/20/2017    7:51 PM 07/09/2017    3:55 PM  Advanced Directives  Does Patient Have a Medical Advance Directive? Yes Yes Yes Yes Yes Yes Yes  Type of Advance Directive Out of facility DNR (pink MOST or yellow form) Healthcare Power of [place];Living will Living will;Healthcare Power of [place] Power of [place];Living will Living will;Healthcare Power of [place] Power of [place];Living will Healthcare Power of [place];Living will  Does patient want to make changes to medical advance directive? No - Patient declined No - Patient declined No - Patient declined No - Patient declined No - Patient declined    Copy of Healthcare Power of Attorney in Chart?  No - copy requested No - copy requested No - copy requested No - copy requested     Current Medications (verified) Outpatient Encounter Medications as of 12/05/2021  Medication Sig   acetaminophen (TYLENOL) 500 MG tablet Take 500 mg by mouth every 6 (six) hours as needed for mild pain.    aspirin 81 MG chewable tablet Chew 1 tablet (81 mg total) by mouth daily.   calcium carbonate (OS-CAL) 600 MG TABS tablet Take 600 mg by mouth 2 (two) times daily with a meal.   carboxymethylcellul-glycerin (REFRESH OPTIVE) 0.5-0.9 % ophthalmic solution Place 1 drop into both eyes daily as needed for dry eyes.   clobetasol ointment (TEMOVATE) 0.05 % Apply 1 application. topically 2 (two) times  daily.   cyanocobalamin (,VITAMIN B-12,) 1000 MCG/ML injection Inject 1 mL once weekly for 3 weeks and then once monthly.   diclofenac Sodium (VOLTAREN) 1 % GEL Apply 2 g topically 4 (four) times daily.   fluticasone (FLONASE) 50 MCG/ACT nasal spray Place 1 spray into both nostrils daily.   isosorbide mononitrate (IMDUR) 30 MG 24 hr tablet TAKE 1 TABLET BY MOUTH DAILY   mometasone (ELOCON) 0.1 % ointment APPLY TOPICALLY EVERY DAY   Multiple Vitamins-Minerals (VISION-VITE PRESERVE PO) Take one by mouth twice a day.   nitroGLYCERIN (NITROSTAT) 0.4 MG SL tablet Place 1 tablet (0.4 mg total) under the tongue every 5 (five) minutes as needed for chest pain.   polyethylene glycol powder (GLYCOLAX/MIRALAX) 17 GM/SCOOP powder Take 17 g by mouth daily as needed for mild constipation.   Red Yeast Rice 600 MG CAPS Take 600 capsules by mouth daily.   No facility-administered encounter medications on file as of 12/05/2021.    Allergies (verified) Penicillins   History: Past Medical History:  Diagnosis Date   Cataracts, bilateral    Chest pain    a. H/o neg stress test ~ 1995.   Coronary atherosclerosis    a. 04/2017 CTA chest: coronary atherosclerosis noted ->pt refused statin.   Degenerative joint disease    rheumatoid arthritis   Gastritis    followed by Dr. 05/2017   History  of tobacco abuse    Macular degeneration    a. legally blind.   Pre-syncope    Squamous cell carcinoma    face, s/p excision   Past Surgical History:  Procedure Laterality Date   APPENDECTOMY  1957   BREAST BIOPSY     x 3, all normal   CATARACT EXTRACTION W/PHACO Left 02/09/2019   Procedure: CATARACT EXTRACTION PHACO AND INTRAOCULAR LENS PLACEMENT (IOC) LEFT 01:12.0        19.0%       13.72;  Surgeon: Galen Manila, MD;  Location: [facility];  Service: Ophthalmology;  Laterality: Left;   CHOLECYSTECTOMY  1989   EXPLORATORY LAPAROTOMY  1963   OOPHORECTOMY  1957   Family History  Problem Relation Age of  Onset   Cancer Neg Hx    Heart disease Neg Hx    Social History   Socioeconomic History   Marital status: Widowed    Spouse name: Not on file   Number of children: Not on file   Years of education: Not on file   Highest education level: Not on file  Occupational History   Not on file  Tobacco Use   Smoking status: Former    Types: Cigarettes    Quit date: 08/20/1984    Years since quitting: 37.3   Smokeless tobacco: Never  Vaping Use   Vaping Use: Never used  Substance and Sexual Activity   Alcohol use: No   Drug use: No   Sexual activity: Not on file  Other Topics Concern   Not on file  Social History Narrative   Lives in retirement facility.  Relatively active but does not routinely exercise.   Social Determinants of Health   Financial Resource Strain: Low Risk  (12/05/2021)   Overall Financial Resource Strain (CARDIA)    Difficulty of Paying Living Expenses: Not hard at all  Food Insecurity: No Food Insecurity (12/05/2021)   Hunger Vital Sign    Worried About Running Out of Food in the Last Year: Never true    Ran Out of Food in the Last Year: Never true  Transportation Needs: No Transportation Needs (12/05/2021)   PRAPARE - Administrator, Civil Service (Medical): No    Lack of Transportation (Non-Medical): No  Physical Activity: Unknown (08/28/2020)   Exercise Vital Sign    Days of Exercise per Week: 0 days    Minutes of Exercise per Session: Not on file  Stress: No Stress Concern Present (12/05/2021)   [employer] of Occupational Health - Occupational Stress Questionnaire    Feeling of Stress : Not at all  Social Connections: Unknown (12/05/2021)   Social Connection and Isolation Panel [NHANES]    Frequency of Communication with Friends and Family: More than three times a week    Frequency of Social Gatherings with Friends and Family: More than three times a week    Attends Religious Services: Not on Marketing executive or  Organizations: Not on file    Attends Banker Meetings: Not on file    Marital Status: Not on file    Tobacco Counseling Counseling given: Not Answered   Clinical Intake:  Pre-visit preparation completed: Yes        Diabetes: No  How often do you need to have someone help you when you read instructions, pamphlets, or other written materials from your doctor or pharmacy?: 2 - Rarely   Activities of Daily Living  No data to display          Patient Care Team: Crecencio Mc, MD as PCP - General (Internal Medicine) Minna Merritts, MD as PCP - Cardiology (Cardiology)  Indicate any recent Medical Services you may have received from other than [facility] providers in the past year (date may be approximate).     Assessment:   This is a routine wellness examination for [place].  Virtual Visit via Telephone Note  I connected with  Debbora Lacrosse on 12/05/21 at 12:30 PM EDT by telephone and verified that I am speaking with the correct person using two identifiers.  Persons participating in the virtual visit: patient/Nurse Health Advisor   I discussed the limitations of performing an evaluation and management service by telehealth. We continued and completed visit with audio only. Some vital signs may be absent or patient reported.   Hearing/Vision screen Hearing Screening - Comments:: Hearing aids Vision Screening - Comments:: Wears corrective lenses They have seen their ophthalmologist  Dietary issues and exercise activities discussed:   Healthy diet Good water intake   Goals Addressed             This Visit's Progress    Follow up with Primary Care Provider       As needed.       Depression Screen    12/05/2021   12:46 PM 10/18/2021   12:31 PM 10/04/2020    2:13 PM 08/28/2020   10:39 AM 08/25/2019   11:01 AM 01/28/2018    9:40 AM 05/16/2017    8:54 AM  PHQ 2/9 Scores  PHQ - 2 Score 0 0 0 0 0 0 4  PHQ- 9 Score   2    7    Fall Risk     12/05/2021   12:37 PM 10/18/2021   12:31 PM 10/04/2020    2:08 PM 08/28/2020   10:45 AM 08/25/2019   10:59 AM  Fall Risk   Falls in the past year? 0 0 0 0 0  Number falls in past yr: 0   0   Injury with Fall?    0   Risk for fall due to :  No Fall Risks     Follow up Falls evaluation completed Falls evaluation completed Falls evaluation completed Falls evaluation completed Falls evaluation completed   [place]: Home free of loose throw rugs in walkways, pet beds, electrical cords, etc? Yes  Adequate lighting in your home to reduce risk of falls? Yes   ASSISTIVE DEVICES UTILIZED TO PREVENT FALLS: Use of a cane, walker or w/c? No   TIMED UP AND GO: Was the test performed? No .   Cognitive Function:  Patient is alert and oriented x3.       08/28/2020   11:12 AM 08/25/2019   11:22 AM  6CIT Screen  What Year? 0 points 0 points  What month? 0 points 0 points  What time? 0 points 0 points  Count back from 20 0 points 0 points  Months in reverse  0 points  Repeat phrase 0 points 0 points  Total Score  0 points    Immunizations Immunization History  Administered Date(s) Administered   Fluad Quad(high Dose 65+) 04/27/2019   Influenza Split 05/16/2011, 02/27/2012   Influenza, High Dose Seasonal PF 03/13/2016, 01/28/2018   Influenza,inj,Quad PF,6+ Mos 03/30/2013, 02/25/2014, 01/02/2015   Influenza-Unspecified 02/07/2017   PFIZER(Purple Top)SARS-COV-2 Vaccination 05/28/2019, 06/16/2019, 02/28/2020, 09/27/2020   Pneumococcal Conjugate-13  12/21/2013   Pneumococcal-Unspecified 05/13/2004   TDAP status: Due, Education has been provided regarding the importance of this vaccine. Advised may receive this vaccine at local pharmacy or Health Dept. Aware to provide a copy of the vaccination record if obtained from local pharmacy or Health Dept. Verbalized acceptance and understanding.  Pneumococcal vaccine status: Due, Education has been provided regarding  the importance of this vaccine. Advised may receive this vaccine at local pharmacy or Health Dept. Aware to provide a copy of the vaccination record if obtained from local pharmacy or Health Dept. Verbalized acceptance and understanding.  Shingrix Completed?: No.    Education has been provided regarding the importance of this vaccine. Patient has been advised to call insurance company to determine out of pocket expense if they have not yet received this vaccine. Advised may also receive vaccine at local pharmacy or Health Dept. Verbalized acceptance and understanding.  Screening Tests Health Maintenance  Topic Date Due   COVID-19 Vaccine (5 - Pfizer series) 12/21/2021 (Originally 11/22/2020)   Zoster Vaccines- Shingrix (1 of 2) 03/07/2022 (Originally 03/12/1979)   DEXA SCAN  04/12/2022 (Originally 03/11/1994)   TETANUS/TDAP  04/12/2022 (Originally [birth year]/1948)   Pneumonia Vaccine 73+ Years old (2 - PPSV23 or PCV20) 12/06/2022 (Originally 12/22/2014)   INFLUENZA VACCINE  12/11/2021   HPV VACCINES  Aged Out   Health Maintenance There are no preventive care reminders to display for this patient.  Lung Cancer Screening: (Low Dose CT Chest recommended if Age 68-80 years, 30 pack-year currently smoking OR have quit w/in 15years.) does not qualify.   Hepatitis C Screening: does not qualify.  Vision Screening: Recommended annual ophthalmology exams for early detection of glaucoma and other disorders of the eye.  Dental Screening: Recommended annual dental exams for proper oral hygiene  Community Resource Referral / Chronic Care Management: CRR required this visit?  No   CCM required this visit?  No      Plan:   Keep all routine maintenance appointments.   I have personally reviewed and noted the following in the patient's chart:   Medical and social history Use of alcohol, tobacco or illicit drugs  Current medications and supplements including opioid prescriptions.  Functional ability  and status Nutritional status Physical activity Advanced directives List of other physicians Hospitalizations, surgeries, and ER visits in previous 12 months Vitals Screenings to include cognitive, depression, and falls Referrals and appointments  In addition, I have reviewed and discussed with patient certain preventive protocols, quality metrics, and best practice recommendations. A written personalized care plan for preventive services as well as general preventive health recommendations were provided to patient.     OBrien-Blaney, Elowyn Raupp L, LPN   [id number]     I have reviewed the above information and agree with above.   Deborra Medina, MD

## 2021-12-05 NOTE — Patient Instructions (Addendum)
  Ms. Reif , Thank you for taking time to come for your Medicare Wellness Visit. I appreciate your ongoing commitment to your health goals. Please review the following plan we discussed and let me know if I can assist you in the future.   These are the goals we discussed:  Goals      Follow up with Primary Care Provider     As needed.        This is a list of the screening recommended for you and due dates:  Health Maintenance  Topic Date Due   COVID-19 Vaccine (5 - Pfizer series) 12/21/2021*   Zoster (Shingles) Vaccine (1 of 2) 03/07/2022*   DEXA scan (bone density measurement)  04/12/2022*   Tetanus Vaccine  04/12/2022*   Pneumonia Vaccine (2 - PPSV23 or PCV20) 12/06/2022*   Flu Shot  12/11/2021   HPV Vaccine  Aged Out  *Topic was postponed. The date shown is not the original due date.

## 2022-01-16 ENCOUNTER — Other Ambulatory Visit: Payer: Self-pay | Admitting: Internal Medicine

## 2022-04-22 ENCOUNTER — Other Ambulatory Visit: Payer: Medicare Other

## 2022-04-23 ENCOUNTER — Telehealth: Payer: Self-pay | Admitting: Internal Medicine

## 2022-04-26 ENCOUNTER — Ambulatory Visit: Payer: Medicare Other | Admitting: Internal Medicine

## 2022-07-23 ENCOUNTER — Other Ambulatory Visit: Payer: Self-pay | Admitting: Internal Medicine

## 2022-10-17 ENCOUNTER — Ambulatory Visit (INDEPENDENT_AMBULATORY_CARE_PROVIDER_SITE_OTHER): Payer: Medicare Other | Admitting: Internal Medicine

## 2022-10-17 ENCOUNTER — Telehealth: Payer: Self-pay | Admitting: Internal Medicine

## 2022-10-17 ENCOUNTER — Encounter: Payer: Self-pay | Admitting: Internal Medicine

## 2022-10-17 VITALS — BP 88/60 | HR 65 | Temp 97.4°F | Ht 63.0 in | Wt 100.8 lb

## 2022-10-17 DIAGNOSIS — E538 Deficiency of other specified B group vitamins: Secondary | ICD-10-CM

## 2022-10-17 DIAGNOSIS — R634 Abnormal weight loss: Secondary | ICD-10-CM

## 2022-10-17 DIAGNOSIS — R4189 Other symptoms and signs involving cognitive functions and awareness: Secondary | ICD-10-CM

## 2022-10-17 DIAGNOSIS — I2510 Atherosclerotic heart disease of native coronary artery without angina pectoris: Secondary | ICD-10-CM

## 2022-10-17 DIAGNOSIS — E559 Vitamin D deficiency, unspecified: Secondary | ICD-10-CM

## 2022-10-17 LAB — COMPREHENSIVE METABOLIC PANEL
ALT: 10 U/L (ref 0–35)
AST: 17 U/L (ref 0–37)
Albumin: 4.1 g/dL (ref 3.5–5.2)
Alkaline Phosphatase: 97 U/L (ref 39–117)
BUN: 22 mg/dL (ref 6–23)
CO2: 32 mEq/L (ref 19–32)
Calcium: 9.6 mg/dL (ref 8.4–10.5)
Chloride: 98 mEq/L (ref 96–112)
Creatinine, Ser: 0.55 mg/dL (ref 0.40–1.20)
GFR: 78.91 mL/min (ref >60.00)
Glucose, Bld: 92 mg/dL (ref 70–99)
Potassium: 4.1 mEq/L (ref 3.5–5.1)
Sodium: 138 mEq/L (ref 135–145)
Total Bilirubin: 0.5 mg/dL (ref 0.2–1.2)
Total Protein: 7.6 g/dL (ref 6.0–8.3)

## 2022-10-17 LAB — LIPID PANEL
Cholesterol: 139 mg/dL (ref 0–200)
HDL: 55.3 mg/dL (ref >39.00)
LDL Cholesterol: 64 mg/dL (ref 0–99)
NonHDL: 84.16
Total CHOL/HDL Ratio: 3
Triglycerides: 101 mg/dL (ref 0.0–149.0)
VLDL: 20.2 mg/dL (ref 0.0–40.0)

## 2022-10-17 LAB — CBC WITH DIFFERENTIAL/PLATELET
Basophils Absolute: 0 10*3/uL (ref 0.0–0.1)
Basophils Relative: 0.4 % (ref 0.0–3.0)
Eosinophils Absolute: 0.1 10*3/uL (ref 0.0–0.7)
Eosinophils Relative: 1 % (ref 0.0–5.0)
HCT: 41.7 % (ref 36.0–46.0)
Hemoglobin: 13.6 g/dL (ref 12.0–15.0)
Lymphocytes Relative: 21.6 % (ref 12.0–46.0)
Lymphs Abs: 2.2 10*3/uL (ref 0.7–4.0)
MCHC: 32.7 g/dL (ref 30.0–36.0)
MCV: 91.9 fl (ref 78.0–100.0)
Monocytes Absolute: 1 10*3/uL (ref 0.1–1.0)
Monocytes Relative: 10.1 % (ref 3.0–12.0)
Neutro Abs: 6.9 10*3/uL (ref 1.4–7.7)
Neutrophils Relative %: 66.9 % (ref 43.0–77.0)
Platelets: 214 10*3/uL (ref 150.0–400.0)
RBC: 4.54 Mil/uL (ref 3.87–5.11)
RDW: 14.6 % (ref 11.5–15.5)
WBC: 10.3 10*3/uL (ref 4.0–10.5)

## 2022-10-17 LAB — TSH: TSH: 1.94 u[IU]/mL (ref 0.35–5.50)

## 2022-10-17 LAB — VITAMIN D 25 HYDROXY (VIT D DEFICIENCY, FRACTURES): VITD: 27.26 ng/mL — ABNORMAL LOW (ref 30.00–100.00)

## 2022-10-17 LAB — B12 AND FOLATE PANEL
Folate: >23.9 ng/mL (ref >5.9)
Vitamin B-12: 116 pg/mL — ABNORMAL LOW (ref 211–911)

## 2022-10-17 MED ORDER — BETAMETHASONE DIPROPIONATE AUG 0.05 % EX CREA: TOPICAL_CREAM | Freq: Two times a day (BID) | CUTANEOUS | 2 refills | Status: DC

## 2022-10-17 MED ORDER — ESCITALOPRAM OXALATE 5 MG PO TABS: 5.0000 mg | ORAL_TABLET | Freq: Every day | ORAL | 1 refills | Status: DC

## 2022-10-17 MED ORDER — ISOSORBIDE MONONITRATE ER 30 MG PO TB24: 30.0000 mg | ORAL_TABLET | Freq: Every day | ORAL | 1 refills | Status: DC

## 2022-10-17 NOTE — Telephone Encounter (Signed)
As per today visit, Dr. Darrick Huntsman wants to f/u with pt in 4 weeks, however, theres no available openings for me to put pt in.

## 2022-10-17 NOTE — Patient Instructions (Addendum)
1)  For your hip pain :  You can  increase  your  tylenol  to 2000 mg of acetominophen (tylenol) every day safely  In divided doses (500 mg every 6 hours  Or 1000 mg every 12 hours.)  if you need to for pain   2)  Your memory problems are  due to depression ,  NOT dementia.    I recommend a trial of lexapro  Please start the Lexapro (escitalopram)  daily in the morning with breakfast   Please return in  4 weeks
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
## 2022-10-18 NOTE — Telephone Encounter (Signed)
Use 4:30 slot as long as pt does not require transportation services. If she does, please use a HFU slot.

## 2022-10-19 DIAGNOSIS — R4189 Other symptoms and signs involving cognitive functions and awareness: Secondary | ICD-10-CM | POA: Insufficient documentation

## 2022-10-19 NOTE — Assessment & Plan Note (Signed)
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

## 2022-10-19 NOTE — Assessment & Plan Note (Signed)
Recurrent, likely contributing to her dementia .  Starting weekly injections   Lab Results  Component Value Date   VITAMINB12 116 (L) 10/17/2022

## 2022-11-18 ENCOUNTER — Ambulatory Visit: Payer: Medicare Other | Admitting: Internal Medicine

## 2023-01-01 ENCOUNTER — Encounter: Payer: Self-pay | Admitting: Internal Medicine

## 2023-01-01 ENCOUNTER — Ambulatory Visit: Payer: Medicare Other | Admitting: Internal Medicine

## 2023-01-01 ENCOUNTER — Ambulatory Visit (INDEPENDENT_AMBULATORY_CARE_PROVIDER_SITE_OTHER): Payer: Medicare Other | Admitting: *Deleted

## 2023-01-01 VITALS — Ht 63.0 in | Wt 100.0 lb

## 2023-01-01 VITALS — BP 124/68 | HR 65 | Temp 97.3°F | Ht 63.0 in | Wt 102.1 lb

## 2023-01-01 DIAGNOSIS — I700 Atherosclerosis of aorta: Secondary | ICD-10-CM

## 2023-01-01 DIAGNOSIS — Z0000 Encounter for general adult medical examination without abnormal findings: Secondary | ICD-10-CM

## 2023-01-01 DIAGNOSIS — R4189 Other symptoms and signs involving cognitive functions and awareness: Secondary | ICD-10-CM

## 2023-01-01 DIAGNOSIS — E538 Deficiency of other specified B group vitamins: Secondary | ICD-10-CM | POA: Diagnosis not present

## 2023-01-01 DIAGNOSIS — H3530 Unspecified macular degeneration: Secondary | ICD-10-CM

## 2023-01-01 MED ORDER — CYANOCOBALAMIN 1000 MCG/ML IJ SOLN
1000.0000 ug | Freq: Once | INTRAMUSCULAR | Status: AC
  Administered 2023-01-01: 1000 ug via INTRAMUSCULAR

## 2023-01-01 NOTE — Patient Instructions (Signed)
Debra Hubbard , Thank you for taking time to come for your Medicare Wellness Visit. I appreciate your ongoing commitment to your health goals. Please review the following plan we discussed and let me know if I can assist you in the future.   Referrals/Orders/Follow-Ups/Clinician Recommendations: None  This is a list of the screening recommended for you and due dates:  Health Maintenance  Topic Date Due   DTaP/Tdap/Td vaccine (1 - Tdap) Never done   Zoster (Shingles) Vaccine (1 of 2) Never done   DEXA scan (bone density measurement)  Never done   Pneumonia Vaccine (2 of 2 - PPSV23 or PCV20) 12/22/2014   COVID-19 Vaccine (5 - 2023-24 season) 01/11/2022   Flu Shot  12/12/2022   Medicare Annual Wellness Visit  01/01/2024   HPV Vaccine  Aged Out    Advanced directives: (Copy Requested) Please bring a copy of your health care power of attorney and living will to the office to be added to your chart at your convenience.  Next Medicare Annual Wellness Visit scheduled for next year: Yes 01/05/24 @ 9:00

## 2023-01-01 NOTE — Progress Notes (Unsigned)
Subjective:  Patient ID: Debra Hubbard, female    DOB: 11-06-1926  Age: 89 y.o. MRN: [id number]  CC: {There were no encounter diagnoses. (Refresh or delete this SmartLink)}   HPI LILLEIGH CRYDER presents for  Chief Complaint  Patient presents with   Medical Management of Chronic Issues    Follow up on concerns for dementia raised in June.  B12 deficiency diagnosed.  Years ago  has not taken supplements in years despite numerous low levels .  IF antibody positive  Lab Results  Component Value Date   VITAMINB12 116 (L) 10/17/2022   Sleeping 8-10 hours per night despite Nocturia x 4 times . Restricts fluid intake after 5 pm  No falls  Does not walk daily due to vision loss ; can't read or watch TV due to MD .  Books on tape  Hard of hearing despite bilateral hearing aides   States that she received a statement that I had evaluated her on 2 occasions and sent records to [facility] (???)     Outpatient Medications Prior to Visit  Medication Sig Dispense Refill   acetaminophen (TYLENOL) 500 MG tablet Take 500 mg by mouth every 6 (six) hours as needed for mild pain.      aspirin 81 MG chewable tablet Chew 1 tablet (81 mg total) by mouth daily. 90 tablet 1   augmented betamethasone dipropionate (DIPROLENE-AF) 0.05 % cream Apply topically 2 (two) times daily. 50 g 2   calcium carbonate (OS-CAL) 600 MG TABS tablet Take 600 mg by mouth 2 (two) times daily with a meal.     carboxymethylcellul-glycerin (REFRESH OPTIVE) 0.5-0.9 % ophthalmic solution Place 1 drop into both eyes daily as needed for dry eyes.     cyanocobalamin (,VITAMIN B-12,) 1000 MCG/ML injection Inject 1 mL once weekly for 3 weeks and then once monthly. 10 mL 3   diclofenac Sodium (VOLTAREN) 1 % GEL Apply 2 g topically 4 (four) times daily. 100 g 5   escitalopram (LEXAPRO) 5 MG tablet Take 1 tablet (5 mg total) by mouth daily. 90 tablet 1   fluticasone (FLONASE) 50 MCG/ACT nasal spray Place 1 spray into both nostrils  daily.     isosorbide mononitrate (IMDUR) 30 MG 24 hr tablet Take 1 tablet (30 mg total) by mouth daily. 90 tablet 1   mometasone (ELOCON) 0.1 % ointment APPLY TOPICALLY EVERY DAY 45 g 0   Multiple Vitamins-Minerals (VISION-VITE PRESERVE PO) Take one by mouth twice a day.     nitroGLYCERIN (NITROSTAT) 0.4 MG SL tablet Place 1 tablet (0.4 mg total) under the tongue every 5 (five) minutes as needed for chest pain. 30 tablet 0   polyethylene glycol powder (GLYCOLAX/MIRALAX) 17 GM/SCOOP powder Take 17 g by mouth daily as needed for mild constipation. 500 g 0   Red Yeast Rice 600 MG CAPS Take 600 capsules by mouth daily.     No facility-administered medications prior to visit.    Review of Systems;  Patient denies headache, fevers, malaise, unintentional weight loss, skin rash, eye pain, sinus congestion and sinus pain, sore throat, dysphagia,  hemoptysis , cough, dyspnea, wheezing, chest pain, palpitations, orthopnea, edema, abdominal pain, nausea, melena, diarrhea, constipation, flank pain, dysuria, hematuria, urinary  Frequency, nocturia, numbness, tingling, seizures,  Focal weakness, Loss of consciousness,  Tremor, insomnia, depression, anxiety, and suicidal ideation.      Objective:  BP 124/68   Pulse 65   Temp (!) 97.3 F (36.3 C) (Oral)   Ht  5\' 3"  (1.6 m)   Wt 102 lb 1.9 oz (46.3 kg)   SpO2 96%   BMI 18.09 kg/m   BP Readings from Last 3 Encounters:  01/01/23 124/68  10/17/22 (!) 88/60  10/18/21 126/68    Wt Readings from Last 3 Encounters:  01/01/23 102 lb 1.9 oz (46.3 kg)  01/01/23 100 lb (45.4 kg)  10/17/22 100 lb 12.8 oz (45.7 kg)    Physical Exam  No results found for: "HGBA1C"  Lab Results  Component Value Date   CREATININE 0.55 10/17/2022   CREATININE 0.53 10/18/2021   CREATININE 0.57 10/04/2020    Lab Results  Component Value Date   WBC 10.3 10/17/2022   HGB 13.6 10/17/2022   HCT 41.7 10/17/2022   PLT 214.0 10/17/2022   GLUCOSE 92 10/17/2022   CHOL  139 10/17/2022   TRIG 101.0 10/17/2022   HDL 55.30 10/17/2022   LDLCALC 64 10/17/2022   ALT 10 10/17/2022   AST 17 10/17/2022   NA 138 10/17/2022   K 4.1 10/17/2022   CL 98 10/17/2022   CREATININE 0.55 10/17/2022   BUN 22 10/17/2022   CO2 32 10/17/2022   TSH 1.94 10/17/2022    No results found.  Assessment & Plan:  .There are no diagnoses linked to this encounter.   I provided 30 minutes of face-to-face time during this encounter reviewing patient's last visit with me, patient's  most recent visit with cardiology,  nephrology,  and neurology,  recent surgical and non surgical procedures, previous  labs and imaging studies, counseling on currently addressed issues,  and post visit ordering to diagnostics and therapeutics .   Follow-up: No follow-ups on file.   Sherlene Shams, MD

## 2023-01-01 NOTE — Patient Instructions (Addendum)
YOUR B12 LEVEL IS LOW AGAIN!  YOU NEED TO RESUME B12 INJECTIONS TO PREVENT NERVE DAMAGE!  WE GAVE YOU YOUR FIRST INJECTION TODAY   PLEASE RETURN  IN ONE WEEK FOR YOUR  2ND  B12 INJECTION .    AFTER 4 OF THESE WEEKLY INJECTIONS ,  WE  WILL RESUME MONTHLY INJECTIONS

## 2023-01-01 NOTE — Progress Notes (Addendum)
Subjective:   Debra Hubbard is a 89 y.o. female who presents for Medicare Annual (Subsequent) preventive examination.  Visit Complete: Virtual  I connected with  [name] on 01/01/23 by a audio enabled telemedicine application and verified that I am speaking with the correct person using two identifiers.  Patient Location: Home  Provider Location: Office/Clinic  I discussed the limitations of evaluation and management by telemedicine. The patient expressed understanding and agreed to proceed.  Vital Signs: Unable to obtain new vitals due to this being a telehealth visit.   Review of Systems     Cardiac Risk Factors include: advanced age (>79men, >80 women);Other (see comment), Risk factor comments: coronary atherosclerosis     Objective:    Today's Vitals   01/01/23 0910  Weight: 100 lb (45.4 kg)  Height: 5\' 3"  (1.6 m)   Body mass index is 17.71 kg/m.     01/01/2023    9:31 AM 12/05/2021   12:48 PM 08/28/2020   10:44 AM 08/25/2019   11:29 AM 02/09/2019    6:55 AM 07/20/2017   11:10 PM 07/20/2017    7:51 PM  Advanced Directives  Does Patient Have a Medical Advance Directive? Yes Yes Yes Yes Yes Yes Yes  Type of Estate agent of [place];Living will Out of facility DNR (pink MOST or yellow form) Healthcare Power of [place];Living will Living will;Healthcare Power of [employer] Power of [place];Living will Living will;Healthcare Power of [employer] Power of [place];Living will  Does patient want to make changes to medical advance directive?  No - Patient declined No - Patient declined No - Patient declined No - Patient declined No - Patient declined   Copy of Healthcare Power of Attorney in Chart? No - copy requested  No - copy requested No - copy requested No - copy requested No - copy requested     Current Medications (verified) Outpatient Encounter Medications as of 01/01/2023  Medication Sig   acetaminophen (TYLENOL) 500  MG tablet Take 500 mg by mouth every 6 (six) hours as needed for mild pain.    aspirin 81 MG chewable tablet Chew 1 tablet (81 mg total) by mouth daily.   augmented betamethasone dipropionate (DIPROLENE-AF) 0.05 % cream Apply topically 2 (two) times daily.   carboxymethylcellul-glycerin (REFRESH OPTIVE) 0.5-0.9 % ophthalmic solution Place 1 drop into both eyes daily as needed for dry eyes.   isosorbide mononitrate (IMDUR) 30 MG 24 hr tablet Take 1 tablet (30 mg total) by mouth daily.   mometasone (ELOCON) 0.1 % ointment APPLY TOPICALLY EVERY DAY   Multiple Vitamins-Minerals (VISION-VITE PRESERVE PO) Take one by mouth twice a day.   polyethylene glycol powder (GLYCOLAX/MIRALAX) 17 GM/SCOOP powder Take 17 g by mouth daily as needed for mild constipation.   calcium carbonate (OS-CAL) 600 MG TABS tablet Take 600 mg by mouth 2 (two) times daily with a meal. (Patient not taking: Reported on 01/01/2023)   cyanocobalamin (,VITAMIN B-12,) 1000 MCG/ML injection Inject 1 mL once weekly for 3 weeks and then once monthly. (Patient not taking: Reported on 01/01/2023)   diclofenac Sodium (VOLTAREN) 1 % GEL Apply 2 g topically 4 (four) times daily. (Patient not taking: Reported on 01/01/2023)   escitalopram (LEXAPRO) 5 MG tablet Take 1 tablet (5 mg total) by mouth daily. (Patient not taking: Reported on 01/01/2023)   fluticasone (FLONASE) 50 MCG/ACT nasal spray Place 1 spray into both nostrils daily. (Patient not taking: Reported on 01/01/2023)   nitroGLYCERIN (NITROSTAT) 0.4 MG  SL tablet Place 1 tablet (0.4 mg total) under the tongue every 5 (five) minutes as needed for chest pain. (Patient not taking: Reported on 01/01/2023)   Red Yeast Rice 600 MG CAPS Take 600 capsules by mouth daily. (Patient not taking: Reported on 01/01/2023)   No facility-administered encounter medications on file as of 01/01/2023.    Allergies (verified) Penicillins   History: Past Medical History:  Diagnosis Date   Cataracts, bilateral     Chest pain    a. H/o neg stress test ~ 1995.   Coronary atherosclerosis    a. 04/2017 CTA chest: coronary atherosclerosis noted ->pt refused statin.   Degenerative joint disease    rheumatoid arthritis   Gastritis    followed by Dr. Bluford Kaufmann   History of tobacco abuse    Macular degeneration    a. legally blind.   Pre-syncope    Squamous cell carcinoma    face, s/p excision   Past Surgical History:  Procedure Laterality Date   APPENDECTOMY  1957   BREAST BIOPSY     x 3, all normal   CATARACT EXTRACTION W/PHACO Left 02/09/2019   Procedure: CATARACT EXTRACTION PHACO AND INTRAOCULAR LENS PLACEMENT (IOC) LEFT 01:12.0        19.0%       13.72;  Surgeon: Galen Manila, MD;  Location: [facility];  Service: Ophthalmology;  Laterality: Left;   CHOLECYSTECTOMY  1989   EXPLORATORY LAPAROTOMY  1963   OOPHORECTOMY  1957   Family History  Problem Relation Age of Onset   Cancer Neg Hx    Heart disease Neg Hx    Social History   Socioeconomic History   Marital status: Widowed    Spouse name: Not on file   Number of children: Not on file   Years of education: Not on file   Highest education level: Not on file  Occupational History   Not on file  Tobacco Use   Smoking status: Former    Current packs/day: 0.00    Types: Cigarettes    Quit date: 08/20/1984    Years since quitting: 38.3   Smokeless tobacco: Never  Vaping Use   Vaping status: Never Used  Substance and Sexual Activity   Alcohol use: No   Drug use: No   Sexual activity: Not on file  Other Topics Concern   Not on file  Social History Narrative   Lives in retirement facility.  Relatively active but does not routinely exercise.   Social Determinants of Health   Financial Resource Strain: Low Risk  (01/01/2023)   Overall Financial Resource Strain (CARDIA)    Difficulty of Paying Living Expenses: Not hard at all  Food Insecurity: No Food Insecurity (01/01/2023)   Hunger Vital Sign    Worried About Running  Out of Food in the Last Year: Never true    Ran Out of Food in the Last Year: Never true  Transportation Needs: No Transportation Needs (01/01/2023)   PRAPARE - Administrator, Civil Service (Medical): No    Lack of Transportation (Non-Medical): No  Physical Activity: Inactive (01/01/2023)   Exercise Vital Sign    Days of Exercise per Week: 0 days    Minutes of Exercise per Session: 0 min  Stress: No Stress Concern Present (01/01/2023)   [employer] of Occupational Health - Occupational Stress Questionnaire    Feeling of Stress : Not at all  Social Connections: Socially Isolated (01/01/2023)   Social Connection and Isolation Panel [NHANES]  Frequency of Communication with Friends and Family: More than three times a week    Frequency of Social Gatherings with Friends and Family: Twice a week    Attends Religious Services: Never    Database administrator or Organizations: No    Attends Banker Meetings: Never    Marital Status: Widowed    Tobacco Counseling Counseling given: Not Answered   Clinical Intake:  Pre-visit preparation completed: Yes  Pain : No/denies pain     BMI - recorded: 17.71 Nutritional Status: BMI <19  Underweight Nutritional Risks: None Diabetes: No  How often do you need to have someone help you when you read instructions, pamphlets, or other written materials from your doctor or pharmacy?: 1 - Never  Interpreter Needed?: No  Information entered by :: R. Maudry Zeidan LPN   Activities of Daily Living    01/01/2023    9:13 AM  In your present state of health, do you have any difficulty performing the following activities:  Hearing? 1  Comment wears aids  Vision? 1  Comment losing vision  Difficulty concentrating or making decisions? 1  Walking or climbing stairs? 0  Dressing or bathing? 0  Doing errands, shopping? 1  Comment needs someone to drivie her  Preparing Food and eating ? N  Using the Toilet? N  In the past six  months, have you accidently leaked urine? Y  Comment wears a pad when she goes out  Do you have problems with loss of bowel control? N  Managing your Medications? N  Managing your Finances? Y  Comment niece helps  Housekeeping or managing your Housekeeping? Y  Comment has help    Patient Care Team: Sherlene Shams, MD as PCP - General (Internal Medicine) Antonieta Iba, MD as PCP - Cardiology (Cardiology)  Indicate any recent Medical Services you may have received from other than [facility] providers in the past year (date may be approximate).     Assessment:   This is a routine wellness examination for [place].  Hearing/Vision screen Hearing Screening - Comments:: Wears aids Vision Screening - Comments:: Losing vision  Dietary issues and exercise activities discussed:     Goals Addressed             This Visit's Progress    Patient Stated       Wants to go more and travel some       Depression Screen    01/01/2023    9:24 AM 10/17/2022   10:32 AM 12/05/2021   12:46 PM 10/18/2021   12:31 PM 10/04/2020    2:13 PM 08/28/2020   10:39 AM 08/25/2019   11:01 AM  PHQ 2/9 Scores  PHQ - 2 Score 0 0 0 0 0 0 0  PHQ- 9 Score 2 0   2      Fall Risk    01/01/2023    9:17 AM 10/17/2022   10:31 AM 12/05/2021   12:37 PM 10/18/2021   12:31 PM 10/04/2020    2:08 PM  Fall Risk   Falls in the past year? 0 0 0 0 0  Number falls in past yr: 0 0 0    Injury with Fall? 0 0     Risk for fall due to : No Fall Risks No Fall Risks  No Fall Risks   Follow up Falls prevention discussed;Falls evaluation completed Falls evaluation completed Falls evaluation completed Falls evaluation completed Falls evaluation completed    MEDICARE RISK AT HOME:  Medicare Risk at Home Any stairs in or around the home?: No If so, are there any without handrails?: No Home free of loose throw rugs in walkways, pet beds, electrical cords, etc?: Yes Adequate lighting in your home to reduce risk of falls?: Yes Life  alert?: Yes Use of a cane, walker or w/c?: No Grab bars in the bathroom?: Yes Shower chair or bench in shower?: No Elevated toilet seat or a handicapped toilet?: No     Cognitive Function:        01/01/2023    9:33 AM 08/28/2020   11:12 AM 08/25/2019   11:22 AM  6CIT Screen  What Year? 0 points 0 points 0 points  What month? 0 points 0 points 0 points  What time? 0 points 0 points 0 points  Count back from 20 2 points 0 points 0 points  Months in reverse 2 points  0 points  Repeat phrase 4 points 0 points 0 points  Total Score 8 points  0 points    Immunizations Immunization History  Administered Date(s) Administered   Fluad Quad(high Dose 65+) 04/27/2019   Influenza Split 05/16/2011, 02/27/2012   Influenza, High Dose Seasonal PF 03/13/2016, 01/28/2018   Influenza,inj,Quad PF,6+ Mos 03/30/2013, 02/25/2014, 01/02/2015   Influenza-Unspecified 02/07/2017   PFIZER(Purple Top)SARS-COV-2 Vaccination 05/28/2019, 06/16/2019, 02/28/2020, 09/27/2020   Pneumococcal Conjugate-13 12/21/2013   Pneumococcal-Unspecified 05/13/2004    TDAP status: Due, Education has been provided regarding the importance of this vaccine. Advised may receive this vaccine at local pharmacy or Health Dept. Aware to provide a copy of the vaccination record if obtained from local pharmacy or Health Dept. Verbalized acceptance and understanding.  Flu Vaccine status: Due, Education has been provided regarding the importance of this vaccine. Advised may receive this vaccine at local pharmacy or Health Dept. Aware to provide a copy of the vaccination record if obtained from local pharmacy or Health Dept. Verbalized acceptance and understanding.  Pneumococcal vaccine status: Due, Education has been provided regarding the importance of this vaccine. Advised may receive this vaccine at local pharmacy or Health Dept. Aware to provide a copy of the vaccination record if obtained from local pharmacy or Health Dept. Verbalized  acceptance and understanding.  Covid-19 vaccine status: Completed vaccines  Qualifies for Shingles Vaccine? Yes   Zostavax completed No   Shingrix Completed?: No.    Education has been provided regarding the importance of this vaccine. Patient has been advised to call insurance company to determine out of pocket expense if they have not yet received this vaccine. Advised may also receive vaccine at local pharmacy or Health Dept. Verbalized acceptance and understanding.  Screening Tests Health Maintenance  Topic Date Due   DTaP/Tdap/Td (1 - Tdap) Never done   Zoster Vaccines- Shingrix (1 of 2) Never done   DEXA SCAN  Never done   Pneumonia Vaccine 27+ Years old (2 of 2 - PPSV23 or PCV20) 12/22/2014   COVID-19 Vaccine (5 - 2023-24 season) 01/11/2022   Medicare Annual Wellness (AWV)  12/06/2022   INFLUENZA VACCINE  12/12/2022   HPV VACCINES  Aged Out    Health Maintenance  Health Maintenance Due  Topic Date Due   DTaP/Tdap/Td (1 - Tdap) Never done   Zoster Vaccines- Shingrix (1 of 2) Never done   DEXA SCAN  Never done   Pneumonia Vaccine 35+ Years old (2 of 2 - PPSV23 or PCV20) 12/22/2014   COVID-19 Vaccine (5 - 2023-24 season) 01/11/2022   Medicare Annual Wellness (AWV)  12/06/2022  INFLUENZA VACCINE  12/12/2022    Colorectal cancer screening: No longer required.   Mammogram status: No longer required due to age.  Bone Density status: Completed years ago per patients. Results reflect: Bone density results: NORMAL. Repeat every 2 years. Declined  Lung Cancer Screening: (Low Dose CT Chest recommended if Age 71-80 years, 20 pack-year currently smoking OR have quit w/in 15years.) does not qualify.    Additional Screening:  Hepatitis C Screening: does not qualify; Completed NA age  Vision Screening: Recommended annual ophthalmology exams for early detection of glaucoma and other disorders of the eye. Is the patient up to date with their annual eye exam?  Yes  Who is the  provider or what is the name of the office in which the patient attends annual eye exams? [facility] If pt is not established with a provider, would they like to be referred to a provider to establish care? No .   Dental Screening: Recommended annual dental exams for proper oral hygiene    Community Resource Referral / Chronic Care Management: CRR required this visit?  No   CCM required this visit?  No     Plan:     I have personally reviewed and noted the following in the patient's chart:   Medical and social history Use of alcohol, tobacco or illicit drugs  Current medications and supplements including opioid prescriptions. Patient is not currently taking opioid prescriptions. Functional ability and status Nutritional status Physical activity Advanced directives List of other physicians Hospitalizations, surgeries, and ER visits in previous 12 months Vitals Screenings to include cognitive, depression, and falls Referrals and appointments  In addition, I have reviewed and discussed with patient certain preventive protocols, quality metrics, and best practice recommendations. A written personalized care plan for preventive services as well as general preventive health recommendations were provided to patient.     Sydell Axon, LPN   2/84/1324   After Visit Summary: (Pick Up) Due to this being a telephonic visit, with patients personalized plan was offered to patient and patient has requested to Pick up at office.  Nurse Notes: None    I have reviewed the above information and agree with above.   Duncan Dull, MD

## 2023-01-02 NOTE — Assessment & Plan Note (Addendum)
Vision deficits are progressingand starting to impact her ability  to remain  INDEPENDENT.

## 2023-01-02 NOTE — Assessment & Plan Note (Signed)
Multifactorial (B12 defieiciency contributing) andprobable vascular dementia , early.  Starting b12 injections

## 2023-01-02 NOTE — Assessment & Plan Note (Signed)
Seen on 2019 chest CT along with 2 vessel disease.  She refuses repeat trial of statin due to memory loss with previous trial.  LDL is 73 with RYR therapy.  Continue asa   Lab Results  Component Value Date   CHOL 139 10/17/2022   HDL 55.30 10/17/2022   LDLCALC 64 10/17/2022   TRIG 101.0 10/17/2022   CHOLHDL 3 10/17/2022

## 2023-01-16 ENCOUNTER — Ambulatory Visit (INDEPENDENT_AMBULATORY_CARE_PROVIDER_SITE_OTHER): Payer: Medicare Other

## 2023-01-16 DIAGNOSIS — E538 Deficiency of other specified B group vitamins: Secondary | ICD-10-CM | POA: Diagnosis not present

## 2023-01-16 MED ORDER — CYANOCOBALAMIN 1000 MCG/ML IJ SOLN
1000.0000 ug | Freq: Once | INTRAMUSCULAR | Status: AC
  Administered 2023-01-16: 1000 ug via INTRAMUSCULAR

## 2023-01-16 NOTE — Progress Notes (Signed)
Patient presented for B 12 injection to right deltoid, patient voiced no concerns nor showed any signs of distress during injection. 

## 2023-01-23 ENCOUNTER — Ambulatory Visit: Payer: Medicare Other

## 2023-01-24 ENCOUNTER — Ambulatory Visit (INDEPENDENT_AMBULATORY_CARE_PROVIDER_SITE_OTHER): Payer: Medicare Other

## 2023-01-24 DIAGNOSIS — E538 Deficiency of other specified B group vitamins: Secondary | ICD-10-CM

## 2023-01-24 MED ORDER — CYANOCOBALAMIN 1000 MCG/ML IJ SOLN
1000.0000 ug | Freq: Once | INTRAMUSCULAR | Status: AC
  Administered 2023-01-24: 1000 ug via INTRAMUSCULAR

## 2023-01-24 NOTE — Progress Notes (Signed)
Patient presented for B 12 injection to left deltoid, patient voiced no concerns nor showed any signs of distress during injection. 

## 2023-01-30 ENCOUNTER — Ambulatory Visit: Payer: Medicare Other

## 2023-01-31 ENCOUNTER — Ambulatory Visit (INDEPENDENT_AMBULATORY_CARE_PROVIDER_SITE_OTHER): Payer: Medicare Other

## 2023-01-31 DIAGNOSIS — E538 Deficiency of other specified B group vitamins: Secondary | ICD-10-CM | POA: Diagnosis not present

## 2023-01-31 MED ORDER — CYANOCOBALAMIN 1000 MCG/ML IJ SOLN
1000.0000 ug | Freq: Once | INTRAMUSCULAR | Status: AC
Start: 2023-01-31 — End: 2023-01-31
  Administered 2023-01-31: 1000 ug via INTRAMUSCULAR

## 2023-01-31 NOTE — Progress Notes (Signed)
B12 administered in R deltoid. Pt tolerated well. No complaints or concerns at this time. Will return for last weekly b12 injection next week

## 2023-02-06 ENCOUNTER — Ambulatory Visit: Payer: Medicare Other

## 2023-02-07 ENCOUNTER — Ambulatory Visit (INDEPENDENT_AMBULATORY_CARE_PROVIDER_SITE_OTHER): Payer: Medicare Other

## 2023-02-07 DIAGNOSIS — E538 Deficiency of other specified B group vitamins: Secondary | ICD-10-CM | POA: Diagnosis not present

## 2023-02-07 MED ORDER — CYANOCOBALAMIN 1000 MCG/ML IJ SOLN
1000.0000 ug | Freq: Once | INTRAMUSCULAR | Status: AC
Start: 2023-02-07 — End: 2023-02-07
  Administered 2023-02-07: 1000 ug via INTRAMUSCULAR

## 2023-02-07 NOTE — Progress Notes (Signed)
Pt presented for their vitamin B12 injection. Pt was identified through two identifiers. Pt tolerated shot well in their left  deltoid.  

## 2023-08-19 ENCOUNTER — Other Ambulatory Visit: Payer: Self-pay | Admitting: Internal Medicine

## 2023-11-10 ENCOUNTER — Other Ambulatory Visit: Payer: Self-pay | Admitting: Internal Medicine

## 2023-11-21 ENCOUNTER — Other Ambulatory Visit: Payer: Self-pay | Admitting: Internal Medicine

## 2023-12-16 ENCOUNTER — Ambulatory Visit: Admitting: Internal Medicine

## 2023-12-23 ENCOUNTER — Other Ambulatory Visit: Payer: Self-pay | Admitting: Internal Medicine

## 2024-01-02 ENCOUNTER — Ambulatory Visit (INDEPENDENT_AMBULATORY_CARE_PROVIDER_SITE_OTHER): Admitting: Internal Medicine

## 2024-01-02 ENCOUNTER — Encounter: Payer: Self-pay | Admitting: Internal Medicine

## 2024-01-02 VITALS — BP 116/70 | HR 63 | Temp 97.5°F | Ht 63.0 in | Wt 101.0 lb

## 2024-01-02 DIAGNOSIS — I700 Atherosclerosis of aorta: Secondary | ICD-10-CM

## 2024-01-02 DIAGNOSIS — R5383 Other fatigue: Secondary | ICD-10-CM | POA: Diagnosis not present

## 2024-01-02 DIAGNOSIS — R7301 Impaired fasting glucose: Secondary | ICD-10-CM | POA: Diagnosis not present

## 2024-01-02 DIAGNOSIS — E785 Hyperlipidemia, unspecified: Secondary | ICD-10-CM

## 2024-01-02 DIAGNOSIS — K5900 Constipation, unspecified: Secondary | ICD-10-CM

## 2024-01-02 DIAGNOSIS — J301 Allergic rhinitis due to pollen: Secondary | ICD-10-CM | POA: Diagnosis not present

## 2024-01-02 DIAGNOSIS — R634 Abnormal weight loss: Secondary | ICD-10-CM

## 2024-01-02 DIAGNOSIS — E538 Deficiency of other specified B group vitamins: Secondary | ICD-10-CM

## 2024-01-02 LAB — CBC WITH DIFFERENTIAL/PLATELET
Basophils Absolute: 0 K/uL (ref 0.0–0.1)
Basophils Relative: 0.5 % (ref 0.0–3.0)
Eosinophils Absolute: 0.1 K/uL (ref 0.0–0.7)
Eosinophils Relative: 1.7 % (ref 0.0–5.0)
HCT: 41 % (ref 36.0–46.0)
Hemoglobin: 13.7 g/dL (ref 12.0–15.0)
Lymphocytes Relative: 27.1 % (ref 12.0–46.0)
Lymphs Abs: 2.1 K/uL (ref 0.7–4.0)
MCHC: 33.5 g/dL (ref 30.0–36.0)
MCV: 91.8 fl (ref 78.0–100.0)
Monocytes Absolute: 0.9 K/uL (ref 0.1–1.0)
Monocytes Relative: 12.2 % — ABNORMAL HIGH (ref 3.0–12.0)
Neutro Abs: 4.6 K/uL (ref 1.4–7.7)
Neutrophils Relative %: 58.5 % (ref 43.0–77.0)
Platelets: 212 K/uL (ref 150.0–400.0)
RBC: 4.47 Mil/uL (ref 3.87–5.11)
RDW: 13.5 % (ref 11.5–15.5)
WBC: 7.8 K/uL (ref 4.0–10.5)

## 2024-01-02 LAB — COMPREHENSIVE METABOLIC PANEL WITH GFR
ALT: 10 U/L (ref 0–35)
AST: 16 U/L (ref 0–37)
Albumin: 4.1 g/dL (ref 3.5–5.2)
Alkaline Phosphatase: 81 U/L (ref 39–117)
BUN: 21 mg/dL (ref 6–23)
CO2: 33 meq/L — ABNORMAL HIGH (ref 19–32)
Calcium: 9.5 mg/dL (ref 8.4–10.5)
Chloride: 98 meq/L (ref 96–112)
Creatinine, Ser: 0.44 mg/dL (ref 0.40–1.20)
GFR: 82.57 mL/min (ref >60.00)
Glucose, Bld: 94 mg/dL (ref 70–99)
Potassium: 4.2 meq/L (ref 3.5–5.1)
Sodium: 138 meq/L (ref 135–145)
Total Bilirubin: 0.5 mg/dL (ref 0.2–1.2)
Total Protein: 7.3 g/dL (ref 6.0–8.3)

## 2024-01-02 LAB — LIPID PANEL
Cholesterol: 129 mg/dL (ref 0–200)
HDL: 46.5 mg/dL (ref >39.00)
LDL Cholesterol: 59 mg/dL (ref 0–99)
NonHDL: 82.59
Total CHOL/HDL Ratio: 3
Triglycerides: 117 mg/dL (ref 0.0–149.0)
VLDL: 23.4 mg/dL (ref 0.0–40.0)

## 2024-01-02 LAB — HEMOGLOBIN A1C: Hgb A1c MFr Bld: 6.3 % (ref 4.6–6.5)

## 2024-01-02 LAB — B12 AND FOLATE PANEL
Folate: >23.4 ng/mL (ref >5.9)
Vitamin B-12: 156 pg/mL — ABNORMAL LOW (ref 211–911)

## 2024-01-02 LAB — LDL CHOLESTEROL, DIRECT: Direct LDL: 72 mg/dL

## 2024-01-02 LAB — TSH: TSH: 1.7 u[IU]/mL (ref 0.35–5.50)

## 2024-01-02 NOTE — Patient Instructions (Addendum)
 I'm glad you are feeling so well!  What a blessing to be so healthy at your age  You can try using flonase (fluticasone)  and /or allegra (fexofenadine)  for your sneezing

## 2024-01-02 NOTE — Progress Notes (Signed)
 Subjective:  Patient ID: Debra Hubbard, female    DOB: 08/23/1926  Age: 89 y.o. MRN: [id number]  CC: The primary encounter diagnosis was Constipation, unspecified constipation type. Diagnoses of Hyperlipidemia, unspecified hyperlipidemia type, Impaired fasting glucose, Other fatigue, Seasonal allergic rhinitis due to pollen, B12 deficiency, Thoracic aortic atherosclerosis (HCC), and Weight loss, unintentional were also pertinent to this visit.   HPI Debra Hubbard presents for  Chief Complaint  Patient presents with   Medical Management of Chronic Issues   [name] is a delightful  89 yr old female with no significant PMH who presents for 6 month follow up   1) Constipation: she states that she is moving her  bowels nearly every day  2) urge incontinence ;  occurring less than daily   3) fatigue:   denies dyspnea and chest pain. Sleeping well .  Worse for the past several weeks    4) rhinitis since May   has not used Flonase several months.  Symptoms worse in the morning ; declines mediation   5) underweight: chronic since adolsecence.  weight has been stable for 2 years   Outpatient Medications Prior to Visit  Medication Sig Dispense Refill   acetaminophen  (TYLENOL ) 500 MG tablet Take 500 mg by mouth every 6 (six) hours as needed for mild pain.      aspirin  81 MG chewable tablet Chew 1 tablet (81 mg total) by mouth daily. 90 tablet 1   augmented betamethasone  dipropionate (DIPROLENE -AF) 0.05 % cream APPLY TOPICALLY TWICE DAILY 50 g 2   calcium  carbonate (OS-CAL) 600 MG TABS tablet Take 600 mg by mouth 2 (two) times daily with a meal.     carboxymethylcellul-glycerin  (REFRESH OPTIVE) 0.5-0.9 % ophthalmic solution Place 1 drop into both eyes daily as needed for dry eyes.     cyanocobalamin  (,VITAMIN B-12,) 1000 MCG/ML injection Inject 1 mL once weekly for 3 weeks and then once monthly. 10 mL 3   diclofenac  Sodium (VOLTAREN ) 1 % GEL Apply 2 g topically 4 (four) times daily. 100 g 5    escitalopram  (LEXAPRO ) 5 MG tablet Take 1 tablet (5 mg total) by mouth daily. 90 tablet 1   fluticasone (FLONASE) 50 MCG/ACT nasal spray Place 1 spray into both nostrils daily.     isosorbide  mononitrate (IMDUR ) 30 MG 24 hr tablet TAKE 1 TABLET BY MOUTH ONCE DAILY 90 tablet 0   mometasone  (ELOCON ) 0.1 % ointment APPLY TOPICALLY EVERY DAY 45 g 0   Multiple Vitamins-Minerals (VISION-VITE PRESERVE PO) Take one by mouth twice a day.     nitroGLYCERIN  (NITROSTAT ) 0.4 MG SL tablet Place 1 tablet (0.4 mg total) under the tongue every 5 (five) minutes as needed for chest pain. 30 tablet 0   polyethylene glycol powder (GLYCOLAX /MIRALAX ) 17 GM/SCOOP powder Take 17 g by mouth daily as needed for mild constipation. 500 g 0   Red Yeast Rice 600 MG CAPS Take 600 capsules by mouth daily.     No facility-administered medications prior to visit.    Review of Systems;  Patient denies headache, fevers, malaise, unintentional weight loss, skin rash, eye pain, sinus congestion and sinus pain, sore throat, dysphagia,  hemoptysis , cough, dyspnea, wheezing, chest pain, palpitations, orthopnea, edema, abdominal pain, nausea, melena, diarrhea, constipation, flank pain, dysuria, hematuria, urinary  Frequency, nocturia, numbness, tingling, seizures,  Focal weakness, Loss of consciousness,  Tremor, insomnia, depression, anxiety, and suicidal ideation.      Objective:  BP 116/70   Pulse 63  Temp (!) 97.5 F (36.4 C)   Ht 5' 3 (1.6 m)   Wt 101 lb (45.8 kg)   SpO2 99%   BMI 17.89 kg/m   BP Readings from Last 3 Encounters:  01/02/24 116/70  01/01/23 124/68  10/17/22 (!) 88/60    Wt Readings from Last 3 Encounters:  01/02/24 101 lb (45.8 kg)  01/01/23 102 lb 1.9 oz (46.3 kg)  01/01/23 100 lb (45.4 kg)    Physical Exam Vitals reviewed.  Constitutional:      General: She is not in acute distress.    Appearance: Normal appearance. She is normal weight. She is not ill-appearing, toxic-appearing or  diaphoretic.  HENT:     Head: Normocephalic.  Eyes:     General: No scleral icterus.       Right eye: No discharge.        Left eye: No discharge.     Conjunctiva/sclera: Conjunctivae normal.  Cardiovascular:     Rate and Rhythm: Normal rate and regular rhythm.     Heart sounds: Normal heart sounds.  Pulmonary:     Effort: Pulmonary effort is normal. No respiratory distress.     Breath sounds: Normal breath sounds.  Musculoskeletal:        General: Normal range of motion.  Skin:    General: Skin is warm and dry.  Neurological:     General: No focal deficit present.     Mental Status: She is alert and oriented to person, place, and time. Mental status is at baseline.  Psychiatric:        Mood and Affect: Mood normal.        Behavior: Behavior normal.        Thought Content: Thought content normal.        Judgment: Judgment normal.     Lab Results  Component Value Date   HGBA1C 6.3 01/02/2024    Lab Results  Component Value Date   CREATININE 0.44 01/02/2024   CREATININE 0.55 10/17/2022   CREATININE 0.53 10/18/2021    Lab Results  Component Value Date   WBC 7.8 01/02/2024   HGB 13.7 01/02/2024   HCT 41.0 01/02/2024   PLT 212.0 01/02/2024   GLUCOSE 94 01/02/2024   CHOL 129 01/02/2024   TRIG 117.0 01/02/2024   HDL 46.50 01/02/2024   LDLDIRECT 72.0 01/02/2024   LDLCALC 59 01/02/2024   ALT 10 01/02/2024   AST 16 01/02/2024   NA 138 01/02/2024   K 4.2 01/02/2024   CL 98 01/02/2024   CREATININE 0.44 01/02/2024   BUN 21 01/02/2024   CO2 33 (H) 01/02/2024   TSH 1.70 01/02/2024   HGBA1C 6.3 01/02/2024    No results found.  Assessment & Plan:  .Constipation, unspecified constipation type  Hyperlipidemia, unspecified hyperlipidemia type -     Lipid panel -     LDL cholesterol, direct  Impaired fasting glucose -     Comprehensive metabolic panel with GFR -     Hemoglobin A1c  Other fatigue -     CBC with Differential/Platelet -     TSH -     B12 and  Folate Panel  Seasonal allergic rhinitis due to pollen Assessment & Plan: Managed with [facility] .  She denies congestion,  Epistaxis and  rhinorrhea  Refills given   B12 deficiency Assessment & Plan: Recurrent, likely contributing to her dementia .  She was started on injection therapy  and received 4 weekly injections in September 2024  but did  not continue monthly injections.  Will resume    Lab Results  Component Value Date   VITAMINB12 156 (L) 01/02/2024      Thoracic aortic atherosclerosis [facility]) Assessment & Plan: Seen on 2019 chest CT along with 2 vessel disease.  She refuses repeat trial of statin due to memory loss with previous trial.  LDL is 73 with RYR therapy.  Continue asa   Lab Results  Component Value Date   CHOL 129 01/02/2024   HDL 46.50 01/02/2024   LDLCALC 59 01/02/2024   LDLDIRECT 72.0 01/02/2024   TRIG 117.0 01/02/2024   CHOLHDL 3 01/02/2024      Weight loss, unintentional Assessment & Plan: Weight is stable.  Prior workup includied CBC, renal and liver function and Chest x ray , all  normal.      Follow-up: No follow-ups on file.   Verneita LITTIE Kettering, MD

## 2024-01-04 ENCOUNTER — Ambulatory Visit: Payer: Self-pay | Admitting: Internal Medicine

## 2024-01-04 NOTE — Assessment & Plan Note (Addendum)
 Recurrent, likely contributing to her dementia .  She was started on injection therapy  and received 4 weekly injections in September 2024  but did not continue monthly injections.  Will resume    Lab Results  Component Value Date   VITAMINB12 156 (L) 01/02/2024

## 2024-01-04 NOTE — Assessment & Plan Note (Signed)
 Weight is stable.  Prior workup includied CBC, renal and liver function and Chest x ray , all  normal.

## 2024-01-04 NOTE — Assessment & Plan Note (Signed)
Managed with Nasacort.  She denies congestion,  Epistaxis and  rhinorrhea  Refills given

## 2024-01-04 NOTE — Assessment & Plan Note (Signed)
 Seen on 2019 chest CT along with 2 vessel disease.  She refuses repeat trial of statin due to memory loss with previous trial.  LDL is 73 with RYR therapy.  Continue asa   Lab Results  Component Value Date   CHOL 129 01/02/2024   HDL 46.50 01/02/2024   LDLCALC 59 01/02/2024   LDLDIRECT 72.0 01/02/2024   TRIG 117.0 01/02/2024   CHOLHDL 3 01/02/2024

## 2024-01-05 ENCOUNTER — Ambulatory Visit (INDEPENDENT_AMBULATORY_CARE_PROVIDER_SITE_OTHER): Payer: Medicare Other | Admitting: *Deleted

## 2024-01-05 VITALS — Ht 61.0 in | Wt 101.0 lb

## 2024-01-05 DIAGNOSIS — Z0000 Encounter for general adult medical examination without abnormal findings: Secondary | ICD-10-CM | POA: Diagnosis not present

## 2024-01-05 NOTE — Progress Notes (Signed)
 Subjective:   Debra Hubbard is a 89 y.o. who presents for a Medicare Wellness preventive visit.  As a reminder, Annual Wellness Visits don't include a physical exam, and some assessments may be limited, especially if this visit is performed virtually. We may recommend an in-person follow-up visit with your provider if needed.  Visit Complete: Virtual I connected with  Laurelai K Nauta on 01/05/24 by a audio enabled telemedicine application and verified that I am speaking with the correct person using two identifiers.  Patient Location: Home  Provider Location: Home Office  I discussed the limitations of evaluation and management by telemedicine. The patient expressed understanding and agreed to proceed.  Vital Signs: Because this visit was a virtual/telehealth visit, some criteria may be missing or patient reported. Any vitals not documented were not able to be obtained and vitals that have been documented are patient reported.  VideoDeclined- This patient declined Librarian, academic. Therefore the visit was completed with audio only.  Persons Participating in Visit: Patient.  AWV Questionnaire: No: Patient Medicare AWV questionnaire was not completed prior to this visit.  Cardiac Risk Factors include: advanced age (>46men, >21 women)     Objective:    Today's Vitals   01/05/24 0844  Weight: 101 lb (45.8 kg)  Height: 5' 1 (1.549 m)   Body mass index is 19.08 kg/m.     01/05/2024    9:04 AM 01/01/2023    9:31 AM 12/05/2021   12:48 PM 08/28/2020   10:44 AM 08/25/2019   11:29 AM 02/09/2019    6:55 AM 07/20/2017   11:10 PM  Advanced Directives  Does Patient Have a Medical Advance Directive? No Yes Yes Yes Yes Yes Yes   Type of Furniture conservator/restorer;Living will Out of facility DNR (pink MOST or yellow form) Healthcare Power of [place];Living will Living will;Healthcare Power of [employer] Power of [place];Living  will Living will;Healthcare Power of Attorney  Does patient want to make changes to medical advance directive?   No - Patient declined No - Patient declined No - Patient declined No - Patient declined No - Patient declined   Copy of Healthcare Power of Attorney in Chart?  No - copy requested  No - copy requested No - copy requested No - copy requested No - copy requested   Would patient like information on creating a medical advance directive? No - Patient declined           Data saved with a previous flowsheet row definition    Current Medications (verified) Outpatient Encounter Medications as of 01/05/2024  Medication Sig   acetaminophen  (TYLENOL ) 500 MG tablet Take 500 mg by mouth every 6 (six) hours as needed for mild pain.    aspirin  81 MG chewable tablet Chew 1 tablet (81 mg total) by mouth daily.   augmented betamethasone  dipropionate (DIPROLENE -AF) 0.05 % cream APPLY TOPICALLY TWICE DAILY   calcium  carbonate (OS-CAL) 600 MG TABS tablet Take 600 mg by mouth 2 (two) times daily with a meal.   carboxymethylcellul-glycerin  (REFRESH OPTIVE) 0.5-0.9 % ophthalmic solution Place 1 drop into both eyes daily as needed for dry eyes.   diclofenac  Sodium (VOLTAREN ) 1 % GEL Apply 2 g topically 4 (four) times daily.   isosorbide  mononitrate (IMDUR ) 30 MG 24 hr tablet TAKE 1 TABLET BY MOUTH ONCE DAILY   Multiple Vitamins-Minerals (VISION-VITE PRESERVE PO) Take one by mouth twice a day.   nitroGLYCERIN  (NITROSTAT ) 0.4 MG SL tablet  Place 1 tablet (0.4 mg total) under the tongue every 5 (five) minutes as needed for chest pain.   cyanocobalamin  (,VITAMIN B-12,) 1000 MCG/ML injection Inject 1 mL once weekly for 3 weeks and then once monthly. (Patient not taking: Reported on 01/05/2024)   escitalopram  (LEXAPRO ) 5 MG tablet Take 1 tablet (5 mg total) by mouth daily. (Patient not taking: Reported on 01/05/2024)   fluticasone (FLONASE) 50 MCG/ACT nasal spray Place 1 spray into both nostrils daily. (Patient not  taking: Reported on 01/05/2024)   mometasone  (ELOCON ) 0.1 % ointment APPLY TOPICALLY EVERY DAY (Patient not taking: Reported on 01/05/2024)   polyethylene glycol powder (GLYCOLAX /MIRALAX ) 17 GM/SCOOP powder Take 17 g by mouth daily as needed for mild constipation. (Patient not taking: Reported on 01/05/2024)   Red Yeast Rice 600 MG CAPS Take 600 capsules by mouth daily. (Patient not taking: Reported on 01/05/2024)   No facility-administered encounter medications on file as of 01/05/2024.    Allergies (verified) Penicillins   History: Past Medical History:  Diagnosis Date   Cataracts, bilateral    Chest pain    a. H/o neg stress test ~ 1995.   Coronary atherosclerosis    a. 04/2017 CTA chest: coronary atherosclerosis noted ->pt refused statin.   Degenerative joint disease    rheumatoid arthritis   Gastritis    followed by Dr. Ora   History of tobacco abuse    Macular degeneration    a. legally blind.   Pre-syncope    Squamous cell carcinoma    face, s/p excision   Past Surgical History:  Procedure Laterality Date   APPENDECTOMY  1957   BREAST BIOPSY     x 3, all normal   CATARACT EXTRACTION W/PHACO Left 02/09/2019   Procedure: CATARACT EXTRACTION PHACO AND INTRAOCULAR LENS PLACEMENT (IOC) LEFT 01:12.0        19.0%       13.72;  Surgeon: Jaye Fallow, MD;  Location: [facility];  Service: Ophthalmology;  Laterality: Left;   CHOLECYSTECTOMY  1989   EXPLORATORY LAPAROTOMY  1963   OOPHORECTOMY  1957   Family History  Problem Relation Age of Onset   Cancer Neg Hx    Heart disease Neg Hx    Social History   Socioeconomic History   Marital status: Widowed    Spouse name: Not on file   Number of children: Not on file   Years of education: Not on file   Highest education level: Not on file  Occupational History   Not on file  Tobacco Use   Smoking status: Former    Current packs/day: 0.00    Types: Cigarettes    Quit date: 08/20/1984    Years since quitting:  39.4   Smokeless tobacco: Never  Vaping Use   Vaping status: Never Used  Substance and Sexual Activity   Alcohol  use: No   Drug use: No   Sexual activity: Not on file  Other Topics Concern   Not on file  Social History Narrative   Lives in retirement facility.  Relatively active but does not routinely exercise.   Social Drivers of Corporate investment banker Strain: Low Risk  (01/05/2024)   Overall Financial Resource Strain (CARDIA)    Difficulty of Paying Living Expenses: Not hard at all  Food Insecurity: No Food Insecurity (01/05/2024)   Hunger Vital Sign    Worried About Running Out of Food in the Last Year: Never true    Ran Out of Food in the  Last Year: Never true  Transportation Needs: No Transportation Needs (01/05/2024)   PRAPARE - Administrator, Civil Service (Medical): No    Lack of Transportation (Non-Medical): No  Physical Activity: Inactive (01/05/2024)   Exercise Vital Sign    Days of Exercise per Week: 0 days    Minutes of Exercise per Session: 0 min  Stress: No Stress Concern Present (01/05/2024)   [employer] of Occupational Health - Occupational Stress Questionnaire    Feeling of Stress: Not at all  Social Connections: Socially Isolated (01/05/2024)   Social Connection and Isolation Panel    Frequency of Communication with Friends and Family: More than three times a week    Frequency of Social Gatherings with Friends and Family: Twice a week    Attends Religious Services: Never    Database administrator or Organizations: No    Attends Banker Meetings: Never    Marital Status: Widowed    Tobacco Counseling Counseling given: Not Answered    Clinical Intake:  Pre-visit preparation completed: Yes  Pain : No/denies pain     BMI - recorded: 19.08 Nutritional Status: BMI of 19-24  Normal Nutritional Risks: None Diabetes: No  Lab Results  Component Value Date   HGBA1C 6.3 01/02/2024     How often do you need to have  someone help you when you read instructions, pamphlets, or other written materials from your doctor or pharmacy?: 1 - Never  Interpreter Needed?: No  Information entered by :: R. Matison Nuccio LPN   Activities of Daily Living     01/05/2024    8:46 AM  In your present state of health, do you have any difficulty performing the following activities:  Hearing? 1  Vision? 1  Difficulty concentrating or making decisions? 1  Walking or climbing stairs? 1  Dressing or bathing? 0  Doing errands, shopping? 1  Preparing Food and eating ? N  Using the Toilet? N  In the past six months, have you accidently leaked urine? Y  Do you have problems with loss of bowel control? N  Managing your Medications? N  Managing your Finances? N  Housekeeping or managing your Housekeeping? Y    Patient Care Team: Marylynn Verneita CROME, MD as PCP - General (Internal Medicine) Gollan, Timothy J, MD as PCP - Cardiology (Cardiology)  I have updated your Care Teams any recent Medical Services you may have received from other providers in the past year.     Assessment:   This is a routine wellness examination for [place].  Hearing/Vision screen Hearing Screening - Comments:: Wears aids Vision Screening - Comments:: glasses   Goals Addressed             This Visit's Progress    Patient Stated       Wants to have a better diet       Depression Screen     01/05/2024    9:00 AM 01/02/2024   11:34 AM 01/01/2023    2:08 PM 01/01/2023    9:24 AM 10/17/2022   10:32 AM 12/05/2021   12:46 PM 10/18/2021   12:31 PM  PHQ 2/9 Scores  PHQ - 2 Score 0 0 1 0 0 0 0  PHQ- 9 Score 0 0 2 2 0      Fall Risk     01/05/2024    8:53 AM 01/02/2024   11:33 AM 01/01/2023    2:08 PM 01/01/2023    9:17 AM 10/17/2022  10:31 AM  Fall Risk   Falls in the past year? 0 0 0 0 0  Number falls in past yr: 0 0 0 0 0  Injury with Fall? 0 0 0 0 0  Risk for fall due to : No Fall Risks No Fall Risks No Fall Risks No Fall Risks No Fall Risks   Follow up Falls evaluation completed;Falls prevention discussed Falls evaluation completed Falls evaluation completed Falls prevention discussed;Falls evaluation completed Falls evaluation completed    MEDICARE RISK AT HOME:  Medicare Risk at Home Any stairs in or around the home?: No If so, are there any without handrails?: No Home free of loose throw rugs in walkways, pet beds, electrical cords, etc?: Yes Adequate lighting in your home to reduce risk of falls?: Yes Life alert?: No Use of a cane, walker or w/c?: No Grab bars in the bathroom?: Yes Shower chair or bench in shower?: No Elevated toilet seat or a handicapped toilet?: No  TIMED UP AND GO:  Was the test performed?  No  Cognitive Function: 6CIT completed        01/05/2024    9:05 AM 01/01/2023    9:33 AM 08/28/2020   11:12 AM 08/25/2019   11:22 AM  6CIT Screen  What Year? 0 points 0 points 0 points 0 points  What month? 0 points 0 points 0 points 0 points  What time? 0 points 0 points 0 points 0 points  Count back from 20 2 points 2 points 0 points 0 points  Months in reverse 0 points 2 points  0 points  Repeat phrase 6 points 4 points 0 points 0 points  Total Score 8 points 8 points  0 points    Immunizations Immunization History  Administered Date(s) Administered   Fluad Quad(high Dose 65+) 04/27/2019, 03/06/2022   Hep B, Unspecified 05/14/1990   Influenza Split 05/16/2011, 02/27/2012   Influenza, High Dose Seasonal PF 03/13/2016, 01/28/2018, 02/19/2023   Influenza,inj,Quad PF,6+ Mos 03/30/2013, 02/25/2014, 01/02/2015   Influenza-Unspecified 02/07/2017   Moderna Covid-19 Fall Seasonal Vaccine 82yrs & older 03/06/2022   PFIZER Comirnaty(Gray Top)Covid-19 Tri-Sucrose Vaccine 09/27/2020   PFIZER(Purple Top)SARS-COV-2 Vaccination 05/18/2019, 05/28/2019, 06/16/2019, 02/28/2020, 09/27/2020   Pfizer Covid-19 Vaccine Bivalent Booster 19yrs & up 02/21/2021   Pneumococcal Conjugate-13 12/21/2013    Pneumococcal-Unspecified 05/13/2004    Screening Tests Health Maintenance  Topic Date Due   DTaP/Tdap/Td (1 - Tdap) Never done   Zoster Vaccines- Shingrix (1 of 2) Never done   DEXA SCAN  Never done   Pneumococcal Vaccine: 50+ Years (2 of 2 - PPSV23, PCV20, or PCV21) 02/15/2014   Medicare Annual Wellness (AWV)  01/01/2024   COVID-19 Vaccine (9 - 2024-25 season) 01/17/2024 (Originally 01/12/2023)   INFLUENZA VACCINE  08/10/2024 (Originally 12/12/2023)   HPV VACCINES  Aged Out   Meningococcal B Vaccine  Aged Out    Health Maintenance  Health Maintenance Due  Topic Date Due   DTaP/Tdap/Td (1 - Tdap) Never done   Zoster Vaccines- Shingrix (1 of 2) Never done   DEXA SCAN  Never done   Pneumococcal Vaccine: 50+ Years (2 of 2 - PPSV23, PCV20, or PCV21) 02/15/2014   Medicare Annual Wellness (AWV)  01/01/2024   Health Maintenance Items Addressed: Discussed the need to update pneumonia, tetanus, shingles and flu vaccines. Patient declines Dexa.  Additional Screening:  Vision Screening: Recommended annual ophthalmology exams for early detection of glaucoma and other disorders of the eye. Overdue  [facility]. Patient stated that she plans  on scheduling an appointment at [facility] Would you like a referral to an eye doctor? No    Dental Screening: Recommended annual dental exams for proper oral hygiene  Community Resource Referral / Chronic Care Management: CRR required this visit?  No   CCM required this visit?  No   Plan:    I have personally reviewed and noted the following in the patient's chart:   Medical and social history Use of alcohol , tobacco or illicit drugs  Current medications and supplements including opioid prescriptions. Patient is not currently taking opioid prescriptions. Functional ability and status Nutritional status Physical activity Advanced directives List of other physicians Hospitalizations, surgeries, and ER visits in previous 12  months Vitals Screenings to include cognitive, depression, and falls Referrals and appointments  In addition, I have reviewed and discussed with patient certain preventive protocols, quality metrics, and best practice recommendations. A written personalized care plan for preventive services as well as general preventive health recommendations were provided to patient.   Angeline Fredericks, LPN   [id number]   After Visit Summary: (Pick Up) Due to this being a telephonic visit, with patients personalized plan was offered to patient and patient has requested to Pick up at office.  Notes: Nothing significant to report at this time.

## 2024-01-05 NOTE — Patient Instructions (Signed)
 Debra Hubbard , Thank you for taking time out of your busy schedule to complete your Annual Wellness Visit with me. I enjoyed our conversation and look forward to speaking with you again next year. I, as well as your care team,  appreciate your ongoing commitment to your health goals. Please review the following plan we discussed and let me know if I can assist you in the future. Your Game plan/ To Do List    Referrals: If you haven't heard from the office you've been referred to, please reach out to them at the phone provided.  Remember to update your flu, pneumonia, tetanus (Tdap) and shingles vaccines as discussed.  Follow up Visits: We will see or speak with you next year for your Next Medicare AWV with our clinical staff 01/05/25 @ 9:30 Have you seen your provider in the last 6 months (3 months if uncontrolled diabetes)? Yes  Clinician Recommendations:  Aim for 30 minutes of exercise or brisk walking, 6-8 glasses of water, and 5 servings of fruits and vegetables each day.       This is a list of the screenings recommended for you:  Health Maintenance  Topic Date Due   DTaP/Tdap/Td vaccine (1 - Tdap) Never done   Zoster (Shingles) Vaccine (1 of 2) Never done   DEXA scan (bone density measurement)  Never done   Pneumococcal Vaccine for age over 46 (2 of 2 - PPSV23, PCV20, or PCV21) 02/15/2014   COVID-19 Vaccine (9 - 2024-25 season) 01/17/2024*   Flu Shot  08/10/2024*   Medicare Annual Wellness Visit  01/04/2025   HPV Vaccine  Aged Out   Meningitis B Vaccine  Aged Out  *Topic was postponed. The date shown is not the original due date.    Advanced directives: (Declined) Advance directive discussed with you today. Even though you declined this today, please call our office should you change your mind, and we can give you the proper paperwork for you to fill out. Advance Care Planning is important because it:  [x]  Makes sure you receive the medical care that is consistent with your values,  goals, and preferences  [x]  It provides guidance to your family and loved ones and reduces their decisional burden about whether or not they are making the right decisions based on your wishes.

## 2024-01-07 ENCOUNTER — Ambulatory Visit: Payer: Self-pay

## 2024-01-07 ENCOUNTER — Ambulatory Visit

## 2024-01-07 NOTE — Telephone Encounter (Signed)
 Patient is scheduled for Friday 01/09/24 at 10:45. I was going to schedule her for 01/08/24 morning but she said she has to give 24 hour notice for transportation.

## 2024-01-07 NOTE — Telephone Encounter (Signed)
 Spoke to Patient and she states she is not dizzy. Patient states when she got up she had some weakness where her legs tried to give out but Patient held her self up so she did not fall. This happened a second time about an hour later after she had breakfast. Patient denies any chills, sore throat, chest pain, fever, dizziness, headache, vision issues, nausea, diarrhea, vomiting or any other symptoms other than a runny nose that at her visit Friday she stated she has had since spring. Once again the Patient confirmed having a runny nose since Spring. Patient states other than this she is her usual self. Patient spoke just like she did on Friday at her visit.

## 2024-01-07 NOTE — Telephone Encounter (Signed)
 FYI Only or Action Required?: Action required by provider: refused ED recommendation-patient canceled injection for today due to symptoms.  Patient was last seen in primary care on 01/02/2024 by Debra Verneita CROME, MD.  Called Nurse Triage reporting Dizziness.  Symptoms began today.  Interventions attempted: Rest, hydration, or home remedies.  Symptoms are: unchanged.  Triage Disposition: Go to ED Now (or PCP Triage)  Patient/caregiver understands and will follow disposition?: No, refuses disposition  Copied from CRM [id number]. Topic: Clinical - Red Word Triage >> Jan 07, 2024  9:32 AM Debra Hubbard wrote: [facility] that prompted transfer to Nurse Triage: Patient said that she has almost fallen twice this morning that is why she cancelled appt for this afternoon. Patient said that she is unsteady with walking and feels that something is wrong. Reason for Disposition  Patient sounds very sick or weak to the triager  Answer Assessment - Initial Assessment Questions 1. DESCRIPTION: Describe your dizziness.     Patient reports she has almost fallen twice due to lightheadedness.  2. LIGHTHEADED: Do you feel lightheaded? (e.g., somewhat faint, woozy, weak upon standing)     yes 3. VERTIGO: Do you feel like either you or the room is spinning or tilting? (i.e., vertigo)     no 4. SEVERITY: How bad is it?  Do you feel like you are going to faint? Can you stand and walk?     Mild-moderate 5. ONSET:  When did the dizziness begin?     Started this morning 6. AGGRAVATING FACTORS: Does anything make it worse? (e.g., standing, change in head position)     nothing 7. HEART RATE: Can you tell me your heart rate? How many beats in 15 seconds?  (Note: Not all patients can do this.)       NA 8. CAUSE: What do you think is causing the dizziness? (e.g., decreased fluids or food, diarrhea, emotional distress, heat exposure, new medicine, sudden standing, vomiting; unknown)     unsure 9.  RECURRENT SYMPTOM: Have you had dizziness before? If Yes, ask: When was the last time? What happened that time?     no 10. OTHER SYMPTOMS: Do you have any other symptoms? (e.g., fever, chest pain, vomiting, diarrhea, bleeding)       no  Protocols used: Dizziness - Lightheadedness-Hubbard-AH

## 2024-01-09 ENCOUNTER — Other Ambulatory Visit

## 2024-01-09 ENCOUNTER — Ambulatory Visit (INDEPENDENT_AMBULATORY_CARE_PROVIDER_SITE_OTHER)

## 2024-01-09 DIAGNOSIS — E538 Deficiency of other specified B group vitamins: Secondary | ICD-10-CM

## 2024-01-09 MED ORDER — CYANOCOBALAMIN 1000 MCG/ML IJ SOLN
1000.0000 ug | Freq: Once | INTRAMUSCULAR | Status: AC
Start: 2024-01-09 — End: 2024-01-09
  Administered 2024-01-09: 1000 ug via INTRAMUSCULAR

## 2024-01-09 NOTE — Progress Notes (Signed)
 Patient is in office today for a nurse visit for B12 Injection. Patient Injection was given in the  Left deltoid. Patient tolerated injection well.

## 2024-01-14 ENCOUNTER — Ambulatory Visit (INDEPENDENT_AMBULATORY_CARE_PROVIDER_SITE_OTHER)

## 2024-01-14 DIAGNOSIS — E538 Deficiency of other specified B group vitamins: Secondary | ICD-10-CM | POA: Diagnosis not present

## 2024-01-14 MED ORDER — CYANOCOBALAMIN 1000 MCG/ML IJ SOLN
1000.0000 ug | Freq: Once | INTRAMUSCULAR | Status: AC
  Administered 2024-01-14: 1000 ug via INTRAMUSCULAR

## 2024-01-14 NOTE — Progress Notes (Signed)
Pt received B12 injection in right deltoid. Pt tolerated it well with no complaints or concerns. 

## 2024-01-21 ENCOUNTER — Ambulatory Visit (INDEPENDENT_AMBULATORY_CARE_PROVIDER_SITE_OTHER)

## 2024-01-21 DIAGNOSIS — E538 Deficiency of other specified B group vitamins: Secondary | ICD-10-CM

## 2024-01-21 DIAGNOSIS — Z23 Encounter for immunization: Secondary | ICD-10-CM

## 2024-01-21 MED ORDER — CYANOCOBALAMIN 1000 MCG/ML IJ SOLN
1000.0000 ug | Freq: Once | INTRAMUSCULAR | Status: AC
  Administered 2024-01-21: 1000 ug via INTRAMUSCULAR

## 2024-01-21 NOTE — Progress Notes (Signed)
 Patient is in office today for a nurse visit for B12 Injection and Flu vaccine. Patient B-12 Injection was given in the Left deltoid, and Flu vaccine in Right deltoid. Patient tolerated injections well.

## 2024-01-28 ENCOUNTER — Ambulatory Visit

## 2024-01-28 ENCOUNTER — Ambulatory Visit (INDEPENDENT_AMBULATORY_CARE_PROVIDER_SITE_OTHER)

## 2024-01-28 DIAGNOSIS — E538 Deficiency of other specified B group vitamins: Secondary | ICD-10-CM | POA: Diagnosis not present

## 2024-01-28 MED ORDER — CYANOCOBALAMIN 1000 MCG/ML IJ SOLN
1000.0000 ug | Freq: Once | INTRAMUSCULAR | Status: AC
  Administered 2024-01-28: 1000 ug via INTRAMUSCULAR

## 2024-01-28 NOTE — Progress Notes (Signed)
 Patient was administered a B12 injection into her right deltoid. Patient tolerated the B12 injection well.

## 2024-02-09 ENCOUNTER — Ambulatory Visit

## 2024-02-24 ENCOUNTER — Telehealth: Payer: Self-pay | Admitting: Internal Medicine

## 2024-02-24 ENCOUNTER — Ambulatory Visit: Payer: Self-pay

## 2024-02-24 DIAGNOSIS — G8911 Acute pain due to trauma: Secondary | ICD-10-CM

## 2024-02-24 NOTE — Telephone Encounter (Signed)
 See triage note.

## 2024-02-24 NOTE — Telephone Encounter (Signed)
 FYI Only or Action Required?: Action required by provider: request for appointment, clinical question for provider, and update on patient condition.  Patient was last seen in primary care on 01/02/2024 by Marylynn Verneita CROME, MD.  Called Nurse Triage reporting Shoulder Pain.  Symptoms began today.  Interventions attempted: Nothing.  Symptoms are: gradually worsening.  Triage Disposition: See HCP Within 4 Hours (Or PCP Triage)  Patient/caregiver understands and will follow disposition?: Unsure  Copied from CRM [id number]. Topic: Clinical - Red Word Triage >> Feb 24, 2024  8:41 AM Franky GRADE wrote: Red Word that prompted transfer to Nurse Triage: Patient stated she fell a couple days ago and hurt her left shoulder, possible fracture. She was taking a shower this morning and heard a pop and felt severe pain . Reason for Disposition  Weakness (i.e., loss of strength) in hand or fingers  (Exception: Not truly weak; hand feels weak because of pain.)  Answer Assessment - Initial Assessment Questions 1. ONSET: When did the pain start?     Today  2. LOCATION: Where is the pain located?     Left Shoulder  3. PAIN: How bad is the pain? (Scale 1-10; or mild, moderate, severe)     Severe  4. WORK OR EXERCISE: Has there been any recent work or exercise that involved this part of the body?     Recently had a fall  5. CAUSE: What do you think is causing the shoulder pain?     Recently had a fall  6. OTHER SYMPTOMS: Do you have any other symptoms? (e.g., neck pain, swelling, rash, fever, numbness, weakness)     Weakness,   7. PREGNANCY: Is there any chance you are pregnant? When was your last menstrual period?     No and No  Protocols used: Shoulder Pain-A-AH

## 2024-02-24 NOTE — Telephone Encounter (Signed)
 See triage notes/unable to document in note.  [place] triage nurse called about patient. Dr Marylynn had a 1pm appointment for tomorrow. Front scheduled appointment, but has not called her to confirm in case Dr Marylynn needs for her to be seen today. Please cancel appointment or let Darice know if appointment is not needed.

## 2024-02-24 NOTE — Telephone Encounter (Signed)
 Pt has been scheduled to see Dr. Marylynn tomorrow at 1 pm. Does pt need to be seen sooner or have an x ray done prior to her appt? Pt stated she fell a couple of days ago and hurt her left shoulder. She stated that she was taking a shower this morning and heard something pop and had severe pain in her left shoulder.

## 2024-02-24 NOTE — Telephone Encounter (Signed)
 Spoke with pt and she stated that she does not have transportation to get her to [facility] today.

## 2024-02-25 ENCOUNTER — Ambulatory Visit (INDEPENDENT_AMBULATORY_CARE_PROVIDER_SITE_OTHER): Admitting: Internal Medicine

## 2024-02-25 ENCOUNTER — Encounter: Payer: Self-pay | Admitting: Internal Medicine

## 2024-02-25 VITALS — BP 120/64 | HR 75 | Ht 61.0 in | Wt 100.4 lb

## 2024-02-25 DIAGNOSIS — E538 Deficiency of other specified B group vitamins: Secondary | ICD-10-CM | POA: Diagnosis not present

## 2024-02-25 DIAGNOSIS — M79622 Pain in left upper arm: Secondary | ICD-10-CM

## 2024-02-25 MED ORDER — CYANOCOBALAMIN 1000 MCG/ML IJ SOLN
1000.0000 ug | Freq: Once | INTRAMUSCULAR | Status: AC
  Administered 2024-02-25: 1000 ug via INTRAMUSCULAR

## 2024-02-25 NOTE — Patient Instructions (Signed)
 You did not break your arm or tear a muscle so you do not need x rays   For your pain:   Increase 1000 mg of tylenol  every 8 hours  or 1300 mg every 12 hours   Ice the should for 15 minutes every couple of hours   To prevent a frozen shoulder :  Apply heat three times daily BEFORE you do the exercises:  1) walk your fingers up the wall until the point that it hurts.  Do this 5 times  2) make enlarging circles  with your arm.

## 2024-02-25 NOTE — Progress Notes (Unsigned)
 Subjective:  Patient ID: Debra Hubbard, female    DOB: 1926/09/20  Age: 89 y.o. MRN: [id number]  CC: There were no encounter diagnoses.   HPI Debra Hubbard presents for  Chief Complaint  Patient presents with   left should pain    Pt had a fall about 3 weeks ago that caused her left shoulder to fall into the wall. Pt stated that pain was not bad then but when she was showering yesterday she heard a popping sound and then the pain severe.    Left shoulder pain off and on after falling 3 weeks ago ,  no bruising  noted.  Lost her balance while kneeling,  left elbow slipped off the bo and she fell into the wall. Yesterday the shoulder popped while taking a both  and was exceedingly .  She continues to have pain but has full ROM<    Outpatient Medications Prior to Visit  Medication Sig Dispense Refill   aspirin  81 MG chewable tablet Chew 1 tablet (81 mg total) by mouth daily. 90 tablet 1   carboxymethylcellul-glycerin  (REFRESH OPTIVE) 0.5-0.9 % ophthalmic solution Place 1 drop into both eyes daily as needed for dry eyes.     cyanocobalamin  (,VITAMIN B-12,) 1000 MCG/ML injection Inject 1 mL once weekly for 3 weeks and then once monthly. 10 mL 3   isosorbide  mononitrate (IMDUR ) 30 MG 24 hr tablet TAKE 1 TABLET BY MOUTH ONCE DAILY 90 tablet 0   Multiple Vitamins-Minerals (VISION-VITE PRESERVE PO) Take one by mouth twice a day.     nitroGLYCERIN  (NITROSTAT ) 0.4 MG SL tablet Place 1 tablet (0.4 mg total) under the tongue every 5 (five) minutes as needed for chest pain. 30 tablet 0   acetaminophen  (TYLENOL ) 500 MG tablet Take 500 mg by mouth every 6 (six) hours as needed for mild pain.      augmented betamethasone  dipropionate (DIPROLENE -AF) 0.05 % cream APPLY TOPICALLY TWICE DAILY 50 g 2   calcium  carbonate (OS-CAL) 600 MG TABS tablet Take 600 mg by mouth 2 (two) times daily with a meal.     diclofenac  Sodium (VOLTAREN ) 1 % GEL Apply 2 g topically 4 (four) times daily. 100 g 5    escitalopram  (LEXAPRO ) 5 MG tablet Take 1 tablet (5 mg total) by mouth daily. (Patient not taking: Reported on 01/05/2024) 90 tablet 1   fluticasone (FLONASE) 50 MCG/ACT nasal spray Place 1 spray into both nostrils daily. (Patient not taking: Reported on 01/05/2024)     mometasone  (ELOCON ) 0.1 % ointment APPLY TOPICALLY EVERY DAY (Patient not taking: Reported on 01/05/2024) 45 g 0   polyethylene glycol powder (GLYCOLAX /MIRALAX ) 17 GM/SCOOP powder Take 17 g by mouth daily as needed for mild constipation. (Patient not taking: Reported on 01/05/2024) 500 g 0   Red Yeast Rice 600 MG CAPS Take 600 capsules by mouth daily. (Patient not taking: Reported on 01/05/2024)     No facility-administered medications prior to visit.    Review of Systems;  Patient denies headache, fevers, malaise, unintentional weight loss, skin rash, eye pain, sinus congestion and sinus pain, sore throat, dysphagia,  hemoptysis , cough, dyspnea, wheezing, chest pain, palpitations, orthopnea, edema, abdominal pain, nausea, melena, diarrhea, constipation, flank pain, dysuria, hematuria, urinary  Frequency, nocturia, numbness, tingling, seizures,  Focal weakness, Loss of consciousness,  Tremor, insomnia, depression, anxiety, and suicidal ideation.      Objective:  BP 120/64   Pulse 75   Ht 5' 1 (1.549 m)   Wt 100  lb 6.4 oz (45.5 kg)   SpO2 95%   BMI 18.97 kg/m   BP Readings from Last 3 Encounters:  02/25/24 120/64  01/02/24 116/70  01/01/23 124/68    Wt Readings from Last 3 Encounters:  02/25/24 100 lb 6.4 oz (45.5 kg)  01/05/24 101 lb (45.8 kg)  01/02/24 101 lb (45.8 kg)    Physical Exam  Lab Results  Component Value Date   HGBA1C 6.3 01/02/2024    Lab Results  Component Value Date   CREATININE 0.44 01/02/2024   CREATININE 0.55 10/17/2022   CREATININE 0.53 10/18/2021    Lab Results  Component Value Date   WBC 7.8 01/02/2024   HGB 13.7 01/02/2024   HCT 41.0 01/02/2024   PLT 212.0 01/02/2024   GLUCOSE  94 01/02/2024   CHOL 129 01/02/2024   TRIG 117.0 01/02/2024   HDL 46.50 01/02/2024   LDLDIRECT 72.0 01/02/2024   LDLCALC 59 01/02/2024   ALT 10 01/02/2024   AST 16 01/02/2024   NA 138 01/02/2024   K 4.2 01/02/2024   CL 98 01/02/2024   CREATININE 0.44 01/02/2024   BUN 21 01/02/2024   CO2 33 (H) 01/02/2024   TSH 1.70 01/02/2024   HGBA1C 6.3 01/02/2024    No results found.  Assessment & Plan:  .There are no diagnoses linked to this encounter.   I spent 34 minutes on the day of this face to face encounter reviewing patient's  most recent visit with cardiology,  nephrology,  and neurology,  prior relevant surgical and non surgical procedures, recent  labs and imaging studies, counseling on weight management,  reviewing the assessment and plan with patient, and post visit ordering and reviewing of  diagnostics and therapeutics with patient  .   Follow-up: No follow-ups on file.   Verneita LITTIE Kettering, MD

## 2024-02-26 DIAGNOSIS — M79622 Pain in left upper arm: Secondary | ICD-10-CM | POA: Insufficient documentation

## 2024-02-26 NOTE — Assessment & Plan Note (Signed)
 Secondary to fall.  She has full ROM  and no bruising.  No imaging needed.  Exercises to avoid frozen shoulder demonstrated and advised

## 2024-02-27 ENCOUNTER — Ambulatory Visit

## 2024-02-27 ENCOUNTER — Telehealth: Payer: Self-pay

## 2024-02-27 NOTE — Telephone Encounter (Signed)
 LMTCB with [place]

## 2024-02-27 NOTE — Telephone Encounter (Signed)
 Copied from CRM [id number]. Topic: Clinical - Medical Advice >> Feb 27, 2024  9:42 AM Viola FALCON wrote: Reason for CRM: Tabitha from [place] at [facility] called for some guidance regarding the patient refusing to go home and paranoia - she wants to know if they should move patient to memory care, the emergency department or needs an FLT2 form? Please call Tabitha at [phone]

## 2024-03-01 NOTE — Telephone Encounter (Signed)
 Spoke with [place] and she stated that they are having some issues with pt. She stated that pt is living in independent living and has not been wanting to go home and having issues of being paranoid that someone is following her around. Ginger stated that on Thursday night the pt went to her neighbors house to stay the night because was afraid to stay alone. Ginger is wanting to see if an FL2 form can be signed for pt to be placed in assisted living per pt and family request. I have placed the FL2 form in the quick sign folder for signature.

## 2024-03-01 NOTE — Telephone Encounter (Signed)
 Faxed to [facility]

## 2024-03-10 ENCOUNTER — Ambulatory Visit

## 2024-03-25 ENCOUNTER — Other Ambulatory Visit: Payer: Self-pay | Admitting: Internal Medicine

## 2024-03-29 ENCOUNTER — Ambulatory Visit (INDEPENDENT_AMBULATORY_CARE_PROVIDER_SITE_OTHER)

## 2024-03-29 DIAGNOSIS — E538 Deficiency of other specified B group vitamins: Secondary | ICD-10-CM | POA: Diagnosis not present

## 2024-03-29 MED ORDER — CYANOCOBALAMIN 1000 MCG/ML IJ SOLN
1000.0000 ug | Freq: Once | INTRAMUSCULAR | Status: AC
  Administered 2024-03-29: 1000 ug via INTRAMUSCULAR

## 2024-03-29 NOTE — Progress Notes (Signed)
 Patient was administered a B12 injection into her left deltoid. Patient tolerated the B12 injection well.

## 2024-03-31 NOTE — Telephone Encounter (Signed)
 open in error

## 2024-04-12 ENCOUNTER — Ambulatory Visit

## 2024-04-28 ENCOUNTER — Ambulatory Visit

## 2024-04-28 ENCOUNTER — Telehealth: Payer: Self-pay

## 2024-04-28 NOTE — Telephone Encounter (Signed)
 Attempted to call Patient to let her know she missed her B12 injection this morning.

## 2025-01-05 ENCOUNTER — Ambulatory Visit
